# Patient Record
Sex: Female | Born: 1980 | Race: White | Hispanic: No | Marital: Married | Smoking: Never smoker
Health system: Southern US, Community
[De-identification: ages and names within clinical notes are randomized; demographics above are authoritative.]

## PROBLEM LIST (undated history)

## (undated) DIAGNOSIS — T8859XA Other complications of anesthesia, initial encounter: Secondary | ICD-10-CM

## (undated) DIAGNOSIS — R519 Headache, unspecified: Secondary | ICD-10-CM

## (undated) DIAGNOSIS — F419 Anxiety disorder, unspecified: Secondary | ICD-10-CM

## (undated) DIAGNOSIS — F32A Depression, unspecified: Secondary | ICD-10-CM

## (undated) DIAGNOSIS — T7840XA Allergy, unspecified, initial encounter: Secondary | ICD-10-CM

## (undated) HISTORY — DX: Depression, unspecified: F32.A

## (undated) HISTORY — DX: Allergy, unspecified, initial encounter: T78.40XA

## (undated) HISTORY — PX: CHOLECYSTECTOMY: SHX55

## (undated) HISTORY — DX: Anxiety disorder, unspecified: F41.9

## (undated) HISTORY — DX: Headache, unspecified: R51.9

## (undated) HISTORY — PX: FOOT SURGERY: SHX648

## (undated) HISTORY — PX: APPENDECTOMY: SHX54

## (undated) HISTORY — DX: Other complications of anesthesia, initial encounter: T88.59XA

## (undated) HISTORY — PX: PALATE / UVULA BIOPSY / EXCISION: SUR128

## (undated) HISTORY — PX: NASAL SINUS SURGERY: SHX719

---

## 2013-06-16 ENCOUNTER — Ambulatory Visit: Payer: Self-pay | Admitting: Physician Assistant

## 2014-07-23 ENCOUNTER — Ambulatory Visit: Payer: Self-pay | Admitting: Emergency Medicine

## 2016-02-27 ENCOUNTER — Encounter: Payer: Self-pay | Admitting: Emergency Medicine

## 2016-02-27 ENCOUNTER — Ambulatory Visit
Admission: EM | Admit: 2016-02-27 | Discharge: 2016-02-27 | Disposition: A | Discharge status: Home / Self Care | Payer: BLUE CROSS/BLUE SHIELD | Attending: Internal Medicine | Admitting: Internal Medicine

## 2016-02-27 ENCOUNTER — Ambulatory Visit (INDEPENDENT_AMBULATORY_CARE_PROVIDER_SITE_OTHER): Payer: BLUE CROSS/BLUE SHIELD

## 2016-02-27 DIAGNOSIS — S93402A Sprain of unspecified ligament of left ankle, initial encounter: Secondary | ICD-10-CM

## 2016-02-27 NOTE — ED Provider Notes (Signed)
[facility]    CSN: [id number] Arrival date & time: 02/27/16  1215   History   Chief Complaint Chief Complaint  Patient presents with  . Ankle Pain    HPI Dana Jennings is a 35 y.o. female. She was chasing her golden retriever across the yard today, stepped in a hole and twisted her left ankle. She has a history of several lateral left ankle sprains. She brought her own boot orthosis in, from previous injury. Does not have crutches. Lateral ankle is swollen and bruised. Was not able to weight bear. No other injury reported.     HPI  History reviewed. No pertinent past medical history.   Past Surgical History:  Procedure Laterality Date  . APPENDECTOMY    . CESAREAN SECTION    . CHOLECYSTECTOMY    . FOOT SURGERY Right   . NASAL SINUS SURGERY       Home Medications    Prior to Admission medications   Medication Sig Start Date End Date Taking? Authorizing Provider  buPROPion (WELLBUTRIN XL) 300 MG 24 hr tablet Take 300 mg by mouth daily.   Yes Historical Provider, MD  levonorgestrel (MIRENA) 20 MCG/24HR IUD 1 each by Intrauterine route once.   Yes Historical Provider, MD    Family History History reviewed. No pertinent family history.  Social History Social History  Substance Use Topics  . Smoking status: Never Smoker  . Smokeless tobacco: Never Used  . Alcohol use Yes     Allergies   Review of patient's allergies indicates no known allergies.   Review of Systems Review of Systems  All other systems reviewed and are negative.    Physical Exam Triage Vital Signs ED Triage Vitals  Enc Vitals Group     BP 02/27/16 1247 (!) 123/54     Pulse Rate 02/27/16 1247 79     Resp 02/27/16 1247 16     Temp 02/27/16 1247 99 F (37.2 C)     Temp Source 02/27/16 1247 Tympanic     SpO2 02/27/16 1247 100 %     Pain Score 02/27/16 1252 5   Updated Vital Signs BP (!) 123/54 (BP Location: Left Arm)   Pulse 79   Temp 99 F (37.2 C)  (Tympanic)   Resp 16   SpO2 100%  Physical Exam  Constitutional: She is oriented to person, place, and time. No distress.  Alert, nicely groomed  HENT:  Head: Atraumatic.  Eyes:  Conjugate gaze, no eye redness/drainage  Neck: Neck supple.  Cardiovascular: Normal rate.   Pulmonary/Chest: No respiratory distress.  Abdominal: She exhibits no distension.  Musculoskeletal: Normal range of motion.  No leg swelling Left lateral ankle is swollen, bruised inferiorly, diffusely tender. Range of motion at the ankle is restricted due to pain. Foot is warm, skin is intact. Able to wiggle her toes.  Neurological: She is alert and oriented to person, place, and time.  Skin: Skin is warm and dry.  No cyanosis  Nursing note and vitals reviewed.    UC Treatments / Results   Radiology Dg Ankle Complete Left  Result Date: 02/27/2016 CLINICAL DATA:  35 year old female with a history of left ankle injury EXAM: LEFT ANKLE COMPLETE - 3+ VIEW COMPARISON:  None. FINDINGS: Small bony defect at the distal left fibula, concerning for avulsion fracture. Associated soft tissue swelling. Irregularity at the medial malleolus, with the appearance of more chronic changes and/or remote injury. Degenerative changes posterior to the talus on the lateral view.  Joint effusion on the lateral view. IMPRESSION: Small avulsion fracture at the distal fibula with associated soft tissue swelling. Degenerative changes of the ankle Signed, Yvone NeuJaime S. Loreta AveWagner, DO [facility] Electronically Signed   By: Gilmer MorJaime  Wagner D.O.   On: 02/27/2016 13:51    Procedures Procedures (including critical care time) None  Final Clinical Impressions(s) / UC Diagnoses   Final diagnoses:  Left ankle sprain, initial encounter    Wear boot orthosis and use crutches; non weight bearing until followup with [facility] in about a week. Ice, advil or aleve, should help with swelling/pain.       Eustace MooreLaura W Chloey Ricard, MD 02/28/16 [phone]

## 2016-02-27 NOTE — Discharge Instructions (Signed)
Use ice 5-10 minutes 2-4 times daily for pain/swelling for the next 48 hours. Wear boot orthosis for comfort and splinting.  Elevate leg if throbbing pain occurs.  Advil or aleve otc should help with discomfort.    Use crutches and keep weight off of L leg until the xray result comes back sometime today; we will call you with the result.    If the ankle is broken, we would recommend followup with orthopedics in 7-10 days.  If sprained, followup with orthopedics as needed for persistent symptoms.

## 2016-02-27 NOTE — ED Triage Notes (Signed)
Patient c/o left ankle pain that started after she stepped in a hole while walking her dog this morning.

## 2016-02-27 NOTE — ED Notes (Signed)
Ice pack applied to left ankle.

## 2016-09-24 ENCOUNTER — Ambulatory Visit
Admission: EM | Admit: 2016-09-24 | Discharge: 2016-09-24 | Disposition: A | Discharge status: Home / Self Care | Payer: BLUE CROSS/BLUE SHIELD | Attending: Family Medicine | Admitting: Family Medicine

## 2016-09-24 ENCOUNTER — Encounter: Payer: Self-pay | Admitting: *Deleted

## 2016-09-24 DIAGNOSIS — J0101 Acute recurrent maxillary sinusitis: Secondary | ICD-10-CM | POA: Diagnosis not present

## 2016-09-24 LAB — RAPID STREP SCREEN (MED CTR MEBANE ONLY): STREPTOCOCCUS, GROUP A SCREEN (DIRECT): NEGATIVE

## 2016-09-24 MED ORDER — FLUTICASONE PROPIONATE 50 MCG/ACT NA SUSP: 2.0000 | Freq: Every day | NASAL | 0 refills | Status: DC

## 2016-09-24 MED ORDER — DOXYCYCLINE HYCLATE 100 MG PO CAPS: 100.0000 mg | ORAL_CAPSULE | Freq: Two times a day (BID) | ORAL | 0 refills | Status: DC

## 2016-09-24 MED ORDER — HYDROCOD POLST-CPM POLST ER 10-8 MG/5ML PO SUER: 5.0000 mL | Freq: Two times a day (BID) | ORAL | 0 refills | Status: DC

## 2016-09-24 NOTE — ED Triage Notes (Signed)
Facial pain, headache, nasal drainage- green, since Thursday, hx of sinus infections with previous sinus surgery. Also, last night awoke with severe sore throat and possible fever.

## 2016-09-24 NOTE — ED Provider Notes (Signed)
CSN: [id number]     Arrival date & time 09/24/16  1026 History   First MD Initiated Contact with Patient 09/24/16 1217     Chief Complaint  Patient presents with  . Facial Pain  . Sore Throat   (Consider location/radiation/quality/duration/timing/severity/associated sxs/prior Treatment) HPI  36 year old female is with a four-day history of facial pain headache nasal drainage of green this in a very sore throat that awaken her last night. She thought that she was feverish last night but did not take her temperature. History of recurrent sinusitis was lessened after having endoscopic sinus surgery but continues to have at least on a yearly basis. Her facial pain is very painful in her throat seems to be her worst at the present time.       History reviewed. No pertinent past medical history. Past Surgical History:  Procedure Laterality Date  . APPENDECTOMY    . CESAREAN SECTION    . CHOLECYSTECTOMY    . FOOT SURGERY Right   . NASAL SINUS SURGERY     History reviewed. No pertinent family history. Social History  Substance Use Topics  . Smoking status: Never Smoker  . Smokeless tobacco: Never Used  . Alcohol use Yes   OB History    No data available     Review of Systems  Constitutional: Positive for activity change, chills, fatigue and fever.  HENT: Positive for congestion, postnasal drip, rhinorrhea, sinus pain, sinus pressure and sore throat.   Respiratory: Positive for cough. Negative for shortness of breath and wheezing.   All other systems reviewed and are negative.   Allergies  Patient has no known allergies.  Home Medications   Prior to Admission medications   Medication Sig Start Date End Date Taking? Authorizing Provider  buPROPion (WELLBUTRIN XL) 300 MG 24 hr tablet Take 300 mg by mouth daily.   Yes Historical Provider, MD  levonorgestrel (MIRENA) 20 MCG/24HR IUD 1 each by Intrauterine route once.   Yes Historical Provider, MD  chlorpheniramine-HYDROcodone  (TUSSIONEX PENNKINETIC ER) 10-8 MG/5ML SUER Take 5 mLs by mouth 2 (two) times daily. 09/24/16   Lutricia FeilWilliam P Farah Lepak, PA-C  doxycycline (VIBRAMYCIN) 100 MG capsule Take 1 capsule (100 mg total) by mouth 2 (two) times daily. 09/24/16   Lutricia FeilWilliam P Kayode Petion, PA-C  fluticasone (FLONASE) 50 MCG/ACT nasal spray Place 2 sprays into both nostrils daily. 09/24/16   Lutricia FeilWilliam P Vaun Hyndman, PA-C   Meds Ordered and Administered this Visit  Medications - No data to display  BP (!) 143/76 (BP Location: Left Arm)   Pulse 73   Temp 98.2 F (36.8 C) (Oral)   Resp 16   Ht 5\' 8"  (1.727 m)   Wt 250 lb (113.4 kg)   SpO2 100%   BMI 38.01 kg/m  No data found.   Physical Exam  Constitutional: She is oriented to person, place, and time. She appears well-developed and well-nourished. No distress.  HENT:  Head: Normocephalic and atraumatic.  Right Ear: External ear normal.  Left Ear: External ear normal.  Nose: Nose normal.  Mouth/Throat: Oropharynx is clear and moist. No oropharyngeal exudate.  Eyes: Pupils are equal, round, and reactive to light. Right eye exhibits no discharge. Left eye exhibits no discharge.  Neck: Normal range of motion. Neck supple.  Pulmonary/Chest: She is in respiratory distress. She has wheezes. She has rales.  Musculoskeletal: Normal range of motion.  Neurological: She is alert and oriented to person, place, and time.  Skin: Skin is warm and dry. She is  not diaphoretic.  Psychiatric: She has a normal mood and affect. Her behavior is normal. Judgment and thought content normal.  Nursing note and vitals reviewed.   Urgent Care Course     Procedures (including critical care time)  Labs Review Labs Reviewed  RAPID STREP SCREEN (NOT AT [facility])  CULTURE, GROUP A STREP [facility])    Imaging Review No results found.   Visual Acuity Review  Right Eye Distance:   Left Eye Distance:   Bilateral Distance:    Right Eye Near:   Left Eye Near:    Bilateral Near:         MDM   1. Acute  recurrent maxillary sinusitis    Discharge Medication List as of 09/24/2016 12:26 PM    START taking these medications   Details  chlorpheniramine-HYDROcodone (TUSSIONEX PENNKINETIC ER) 10-8 MG/5ML SUER Take 5 mLs by mouth 2 (two) times daily., Starting Mon 09/24/2016, Print    doxycycline (VIBRAMYCIN) 100 MG capsule Take 1 capsule (100 mg total) by mouth 2 (two) times daily., Starting Mon 09/24/2016, Normal    fluticasone (FLONASE) 50 MCG/ACT nasal spray Place 2 sprays into both nostrils daily., Starting Mon 09/24/2016, Normal        Plan:  1. Test/x-ray results and diagnosis reviewed with patient 2. rx as per orders; risks, benefits, potential side effects reviewed with patient 3. Recommend supportive treatment with Flonase. Use of Tussionex at nighttime for coughing. During the daytime gargle with salt water or use lozenges. Not improving follow-up with her primary care physician 4. F/u prn if symptoms worsen or don't improve     Lutricia Feil, PA-C 09/24/16 1242

## 2016-09-27 LAB — CULTURE, GROUP A STREP (THRC)

## 2020-06-01 ENCOUNTER — Other Ambulatory Visit: Payer: Self-pay

## 2020-06-01 ENCOUNTER — Encounter: Payer: Self-pay | Admitting: Obstetrics and Gynecology

## 2020-06-01 ENCOUNTER — Ambulatory Visit: Payer: BC Managed Care – PPO | Admitting: Obstetrics and Gynecology

## 2020-06-01 VITALS — BP 127/89 | Ht 68.0 in | Wt 267.0 lb

## 2020-06-01 DIAGNOSIS — R102 Pelvic and perineal pain: Secondary | ICD-10-CM | POA: Diagnosis not present

## 2020-06-01 DIAGNOSIS — R222 Localized swelling, mass and lump, trunk: Secondary | ICD-10-CM | POA: Diagnosis not present

## 2020-06-01 NOTE — Progress Notes (Signed)
NP Fibroids/Mass. Procedure room

## 2020-06-01 NOTE — Progress Notes (Signed)
Obstetrics & Gynecology Office Visit   Chief Complaint:  Chief Complaint  Patient presents with  . Establish Care    Fibroids/Mass    History of Present Illness: 39 year caucasian female presenting for evaluation of pelvic pain.  The onset was over a year ago and initially was scheduled for evaluation by general surgery until pandemic hit and her appointment was canceled.  The pain is located in the left lower quadrant.  It is not temporally correlated to menstrual cycles (the patient currently is using a Mirena IUD).  The pain is located in the anterior left lower quadrant abdominal wall and she abel to palpate a mass in the are which she first noted in the shower.  Pain is waxing and waning in nature.  No dyspareunia.  No constipation, diarrhea, hematochezia, or rectal discomfort with bowl movement. The patient is status laparoscopic post emergency appendectomy in 2014 as well as well as prior laparoscopic cholecystectomy.  Imaging from 2014 otherwise showed normal abdominal and pelvic anatomy and external soft tissue imaged normally.  She under a [facility] at [facility] recently on 05/16/2020 which was essential normal other than two small intramural fibroid which do not explain her symptom constellation.  IUD was also shows to be in proper location with appropriate deployment of both arms within the endometrial cavity.  Review of Systems: Review of Systems  Constitutional: Negative.   Gastrointestinal: Positive for abdominal pain. Negative for blood in stool, constipation, diarrhea, heartburn, melena, nausea and vomiting.  Genitourinary: Negative.   Skin: Negative.      Past Medical History:  History reviewed. No pertinent past medical history.  Past Surgical History:  Past Surgical History:  Procedure Laterality Date  . APPENDECTOMY    . CESAREAN SECTION    . CHOLECYSTECTOMY    . FOOT SURGERY Right   . NASAL SINUS SURGERY      Gynecologic History: No LMP recorded. (Menstrual status:  IUD).  Obstetric History: No obstetric history on file.  Family History:  History reviewed. No pertinent family history.  Social History:  Social History   Socioeconomic History  . Marital status: Married    Spouse name: Not on file  . Number of children: Not on file  . Years of education: Not on file  . Highest education level: Not on file  Occupational History  . Not on file  Tobacco Use  . Smoking status: Never Smoker  . Smokeless tobacco: Never Used  Vaping Use  . Vaping Use: Never used  Substance and Sexual Activity  . Alcohol use: Yes  . Drug use: No  . Sexual activity: Yes    Birth control/protection: I.U.D.  Other Topics Concern  . Not on file  Social History Narrative  . Not on file   Social Determinants of Health   Financial Resource Strain:   . Difficulty of Paying Living Expenses: Not on file  Food Insecurity:   . Worried About Programme researcher, broadcasting/film/video in the Last Year: Not on file  . Ran Out of Food in the Last Year: Not on file  Transportation Needs:   . Lack of Transportation (Medical): Not on file  . Lack of Transportation (Non-Medical): Not on file  Physical Activity:   . Days of Exercise per Week: Not on file  . Minutes of Exercise per Session: Not on file  Stress:   . Feeling of Stress : Not on file  Social Connections:   . Frequency of Communication with Friends and  Family: Not on file  . Frequency of Social Gatherings with Friends and Family: Not on file  . Attends Religious Services: Not on file  . Active Member of Clubs or Organizations: Not on file  . Attends Banker Meetings: Not on file  . Marital Status: Not on file  Intimate Partner Violence:   . Fear of Current or Ex-Partner: Not on file  . Emotionally Abused: Not on file  . Physically Abused: Not on file  . Sexually Abused: Not on file    Allergies:  Allergies  Allergen Reactions  . Ceclor [Cefaclor] Rash    Medications: Prior to Admission medications     Medication Sig Start Date End Date Taking? Authorizing Provider  ALPRAZolam Prudy Feeler) 0.5 MG tablet Take 0.5 mg by mouth 3 (three) times daily as needed. 02/16/20  Yes [provider]  buPROPion (WELLBUTRIN XL) 300 MG 24 hr tablet Take 300 mg by mouth daily.   Yes [provider]  levonorgestrel (MIRENA) 20 MCG/24HR IUD 1 each by Intrauterine route once.   Yes [provider]  chlorpheniramine-HYDROcodone (TUSSIONEX PENNKINETIC ER) 10-8 MG/5ML SUER Take 5 mLs by mouth 2 (two) times daily. Patient not taking: Reported on 06/01/2020 09/24/16   Lutricia Feil, PA-C  doxycycline (VIBRAMYCIN) 100 MG capsule Take 1 capsule (100 mg total) by mouth 2 (two) times daily. Patient not taking: Reported on 06/01/2020 09/24/16   Lutricia Feil, PA-C  fluticasone [facility]) 50 MCG/ACT nasal spray Place 2 sprays into both nostrils daily. Patient not taking: Reported on 06/01/2020 09/24/16   Lutricia Feil, PA-C    Physical Exam Vitals:  Vitals:   06/01/20 1444  BP: 127/89   No LMP recorded. (Menstrual status: IUD).  General: NAD, well nourished, appears stated age HEENT: normocephalic, anicteric Pulmonary: No increased work of breathing Abdomen: soft, point tenderness in the LLQ, there is a small non-mobile marble size lesion appreciated in the left lower quadrant just lateral and superior to the patient prior cesarean scar, non-distended.  Umbilicus without lesions.  No hepatomegaly, splenomegaly or masses palpable. No evidence of hernia  Extremities: no edema, erythema, or tenderness Neurologic: Grossly intact Psychiatric: mood appropriate, affect full  Physical Exam Abdominal:      Assessment: 39 y.o. pelvic pain, anterior abdominal wall mass  Plan: Problem List Items Addressed This Visit    None    Visit Diagnoses    Abdominal wall mass    -  Primary   Relevant Orders   MR PELVIS W CONTRAST   Pelvic pain       Relevant Orders   MR PELVIS W CONTRAST      1) Anterior abdominal wall pain with palpable nodule - DDx includes C-section scar endometriosis, lipoma or hernia deemed less likely although a small port site hernia from her appendectomy is possible as that would not have shown up on imaging in 2014 preceding her appendectomy.  It has been present for over a year with a waxing and waning course but recently been more pronounce.  Will proceed with MRI pelvis to further evaluate.    2) A total of 20 minutes were spent in face-to-face contact with the patient during this encounter with over half of that time devoted to counseling and coordination of care.  3) Follow up - will contact patient with imaging results once these become available and have been reviewed    Vena Austria, MD, [facility], [facility]

## 2020-06-07 ENCOUNTER — Other Ambulatory Visit: Payer: Self-pay | Admitting: Obstetrics and Gynecology

## 2020-06-07 DIAGNOSIS — R1904 Left lower quadrant abdominal swelling, mass and lump: Secondary | ICD-10-CM

## 2020-06-13 ENCOUNTER — Ambulatory Visit
Admission: RE | Admit: 2020-06-13 | Discharge: 2020-06-13 | Disposition: A | Discharge status: Home / Self Care | Payer: BC Managed Care – PPO | Source: Ambulatory Visit | Attending: Obstetrics and Gynecology | Admitting: Obstetrics and Gynecology

## 2020-06-13 ENCOUNTER — Other Ambulatory Visit: Payer: Self-pay

## 2020-06-13 ENCOUNTER — Other Ambulatory Visit: Payer: Self-pay | Admitting: Obstetrics and Gynecology

## 2020-06-13 DIAGNOSIS — R1904 Left lower quadrant abdominal swelling, mass and lump: Secondary | ICD-10-CM | POA: Insufficient documentation

## 2020-06-16 ENCOUNTER — Other Ambulatory Visit: Payer: Self-pay | Admitting: Obstetrics and Gynecology

## 2020-06-16 DIAGNOSIS — R1904 Left lower quadrant abdominal swelling, mass and lump: Secondary | ICD-10-CM

## 2020-06-16 DIAGNOSIS — R102 Pelvic and perineal pain: Secondary | ICD-10-CM

## 2020-06-17 ENCOUNTER — Telehealth: Payer: Self-pay | Admitting: Obstetrics and Gynecology

## 2020-06-17 NOTE — Telephone Encounter (Signed)
Pt insurance wants her to go to another provider for CT  Scan vs provider requested by you. Pt request call back.

## 2020-06-21 ENCOUNTER — Ambulatory Visit
Admission: RE | Admit: 2020-06-21 | Discharge: 2020-06-21 | Disposition: A | Discharge status: Home / Self Care | Payer: BC Managed Care – PPO | Source: Ambulatory Visit | Attending: Obstetrics and Gynecology | Admitting: Obstetrics and Gynecology

## 2020-06-21 ENCOUNTER — Other Ambulatory Visit: Payer: Self-pay

## 2020-06-21 DIAGNOSIS — R102 Pelvic and perineal pain: Secondary | ICD-10-CM

## 2020-06-21 MED ORDER — IOHEXOL 300 MG/ML  SOLN
150.0000 mL | Freq: Once | INTRAMUSCULAR | Status: AC | PRN
  Administered 2020-06-21: 125 mL via INTRAVENOUS

## 2020-06-27 ENCOUNTER — Telehealth: Payer: Self-pay | Admitting: Surgery

## 2020-06-27 ENCOUNTER — Other Ambulatory Visit: Payer: Self-pay

## 2020-06-27 ENCOUNTER — Ambulatory Visit (INDEPENDENT_AMBULATORY_CARE_PROVIDER_SITE_OTHER): Payer: BC Managed Care – PPO | Admitting: Surgery

## 2020-06-27 ENCOUNTER — Encounter: Payer: Self-pay | Admitting: Surgery

## 2020-06-27 VITALS — BP 130/77 | HR 80 | Temp 98.4°F | Ht 68.0 in | Wt 275.0 lb

## 2020-06-27 DIAGNOSIS — R1904 Left lower quadrant abdominal swelling, mass and lump: Secondary | ICD-10-CM | POA: Diagnosis not present

## 2020-06-27 NOTE — Patient Instructions (Addendum)
We will schedule you for surgery at [facility] with Dr Everlene Farrier. You will go home the same day as surgery.   Please refer to your [facility] surgery sheet. Our surgery scheduler will be in contact with you to look at surgery dates and go over information.   You should expect to be out of work for about a week to ten days.

## 2020-06-27 NOTE — Telephone Encounter (Signed)
Outgoing call is made, left message for patient to call.  Please advise patient of Pre-Admission date/time, COVID Testing date and Surgery date.  Surgery Date: 07/01/20 Preadmission Testing Date: 06/29/20 (phone 8a-1p) Covid Testing Date: 06/30/20 - patient [facility] to [facility] [facility]) between 8a-1p   Also patient needs to call [phone], between 1-3:00pm the day before surgery, to find out what time to arrive for surgery.

## 2020-06-27 NOTE — Telephone Encounter (Signed)
Received call back from patient.  She is now informed of all dates regarding her surgery and voices understanding.

## 2020-06-28 ENCOUNTER — Encounter: Payer: Self-pay | Admitting: Surgery

## 2020-06-28 NOTE — H&P (View-Only) (Signed)
Patient ID: Dana Jennings, female   DOB: 02/24/1981, 39 y.o.   MRN: [id number]  HPI Dana Jennings is a 39 y.o. female seen in consultation at the request of Dr. Staebler.  Reports feeling not within the left lower quadrant.  She experiences pain that is dull in nature and constant.  This has been happening for over a year.  She reports that the pain is exacerbated when she pushes on her abdominal wall but there is no specific alleviating factors.  She denies any fevers any chills any weight loss.  He does have a significant history of appendectomy as well as laparoscopic cholecystectomy more than 7 years ago.  No complications.  She did have a C-section 14 years ago.  He was seen by Dr. Staebler and also by [facility] and they perform a work-up showing evidence of an intramural fibroid.  She subsequently had an ultrasound and CT scan of the pelvis that I have personally reviewed there is evidence of a 2 x 1.5 cm mass within the abdominal wall on the left lower quadrant this is superficial to the fascia.  There is no evidence of any abdominal wall hernias. She is otherwise healthy and is able to perform more than 4 METS of activity without any shortness of breath or chest pain.  She does have a BMI of 41.8 She did have a normal CMP and hemoglobin 1 AC. She is accompanied by her husband who is a Fire chief  HPI  Past Medical History:  Diagnosis Date  . Allergy     Past Surgical History:  Procedure Laterality Date  . APPENDECTOMY    . CESAREAN SECTION    . CHOLECYSTECTOMY    . FOOT SURGERY Right   . NASAL SINUS SURGERY      No family history on file.  Social History Social History   Tobacco Use  . Smoking status: Never Smoker  . Smokeless tobacco: Never Used  Vaping Use  . Vaping Use: Never used  Substance Use Topics  . Alcohol use: Yes  . Drug use: No    Allergies  Allergen Reactions  . Ceclor [Cefaclor] Hives and Rash    Current Outpatient  Medications  Medication Sig Dispense Refill  . ALPRAZolam (XANAX) 0.5 MG tablet Take 0.5 mg by mouth daily as needed for anxiety.    . buPROPion (WELLBUTRIN XL) 300 MG 24 hr tablet Take 300 mg by mouth daily.    . EPINEPHrine 0.3 mg/0.3 mL IJ SOAJ injection Inject 0.3 mg into the muscle as needed for anaphylaxis.    . levonorgestrel (MIRENA) 20 MCG/24HR IUD 1 each by Intrauterine route once.    . Ascorbic Acid (VITAMIN C PO) Take 1 tablet by mouth daily.    . cetirizine (ZYRTEC) 10 MG tablet Take 10 mg by mouth daily.    . ibuprofen (ADVIL) 200 MG tablet Take 800 mg by mouth every 6 (six) hours as needed for mild pain or headache.    . metroNIDAZOLE (METROGEL) 0.75 % gel Apply 1 application topically at bedtime.    . Multiple Vitamins-Minerals (ZINC PO) Take 1 tablet by mouth daily.    . VITAMIN D PO Take 1 capsule by mouth every other day.     No current facility-administered medications for this visit.     Review of Systems Full ROS  was asked and was negative except for the information on the HPI  Physical Exam Blood pressure 130/77, pulse 80, temperature 98.4 F (36.9 C),   height 5\' 8"  (1.727 m), weight 275 lb (124.7 kg), SpO2 98 %. CONSTITUTIONAL: NAD EYES: Pupils are equal, round,  to light, Sclera are non-icteric. EARS, NOSE, MOUTH AND THROAT: She is wearing a mask hearing is intact to voice. LYMPH NODES:  Lymph nodes in the neck are normal. NEck: no masses, no JVD , nml thyroid RESPIRATORY:  Lungs are clear. There is normal respiratory effort, with equal breath sounds bilaterally, and without pathologic use of accessory muscles. CARDIOVASCULAR: Heart is regular without murmurs, gallops, or rubs. GI: The abdomen is  soft, there is evidence of a left lower quadrant abdominal wall nodule/mass that is tender to palpation.  Does not feel like cancer. GU: Rectal deferred.   MUSCULOSKELETAL: Normal muscle strength and tone. No cyanosis or edema.   SKIN: Turgor is good and there are  no pathologic skin lesions or ulcers. NEUROLOGIC: Motor and sensation is grossly normal. Cranial nerves are grossly intact. PSYCH:  Oriented to person, place and time. Affect is normal.  Data Reviewed I have personally reviewed the patient's imaging, laboratory findings and medical records.    Assessment/Plan 39 year old female with past symptomatic abdominal wall mass located in the left lower quadrant near the left suprapubic area.  Differentials will include reactive lymph node versus benign neoplasia versus endometrioma. Given symptomatology I do think that excision is indicated.  I do think that given the depth and location she is better served to have this excised under general anesthetic.  Procedure discussed with the patient in detail.  Risks, benefits and possible complications including but not limited to: Bleeding, recurrence, chronic pain, upgrading of her pathology, seroma and infection.  She understands and wished to proceed. A Copy of this report was sent to the referring provider   20, MD FACS General Surgeon 06/28/2020, 7:37 AM

## 2020-06-28 NOTE — Progress Notes (Signed)
Patient ID: Dana Jennings, female   DOB: Jul 05, 1981, 39 y.o.   MRN: [id number]  HPI Dana Jennings is a 39 y.o. female seen in consultation at the request of Dr. Bonney Aid.  Reports feeling not within the left lower quadrant.  She experiences pain that is dull in nature and constant.  This has been happening for over a year.  She reports that the pain is exacerbated when she pushes on her abdominal wall but there is no specific alleviating factors.  She denies any fevers any chills any weight loss.  He does have a significant history of appendectomy as well as laparoscopic cholecystectomy more than 7 years ago.  No complications.  She did have a C-section 14 years ago.  He was seen by Dr. Bonney Aid and also by [facility] gynecology and they perform a work-up showing evidence of an intramural fibroid.  She subsequently had an ultrasound and CT scan of the pelvis that I have personally reviewed there is evidence of a 2 x 1.5 cm mass within the abdominal wall on the left lower quadrant this is superficial to the fascia.  There is no evidence of any abdominal wall hernias. She is otherwise healthy and is able to perform more than 4 METS of activity without any shortness of breath or chest pain.  She does have a BMI of 41.8 She did have a normal CMP and hemoglobin 1 AC. She is accompanied by her husband who is a Engineer, structural  HPI  Past Medical History:  Diagnosis Date  . Allergy     Past Surgical History:  Procedure Laterality Date  . APPENDECTOMY    . CESAREAN SECTION    . CHOLECYSTECTOMY    . FOOT SURGERY Right   . NASAL SINUS SURGERY      No family history on file.  Social History Social History   Tobacco Use  . Smoking status: Never Smoker  . Smokeless tobacco: Never Used  Vaping Use  . Vaping Use: Never used  Substance Use Topics  . Alcohol use: Yes  . Drug use: No    Allergies  Allergen Reactions  . Ceclor [Cefaclor] Hives and Rash    Current Outpatient  Medications  Medication Sig Dispense Refill  . ALPRAZolam (XANAX) 0.5 MG tablet Take 0.5 mg by mouth daily as needed for anxiety.    Marland Kitchen buPROPion (WELLBUTRIN XL) 300 MG 24 hr tablet Take 300 mg by mouth daily.    Marland Kitchen EPINEPHrine 0.3 mg/0.3 mL IJ SOAJ injection Inject 0.3 mg into the muscle as needed for anaphylaxis.    Marland Kitchen levonorgestrel (MIRENA) 20 MCG/24HR IUD 1 each by Intrauterine route once.    . Ascorbic Acid (VITAMIN C PO) Take 1 tablet by mouth daily.    . cetirizine (ZYRTEC) 10 MG tablet Take 10 mg by mouth daily.    Marland Kitchen ibuprofen (ADVIL) 200 MG tablet Take 800 mg by mouth every 6 (six) hours as needed for mild pain or headache.    . metroNIDAZOLE (METROGEL) 0.75 % gel Apply 1 application topically at bedtime.    . Multiple Vitamins-Minerals (ZINC PO) Take 1 tablet by mouth daily.    Marland Kitchen VITAMIN D PO Take 1 capsule by mouth every other day.     No current facility-administered medications for this visit.     Review of Systems Full ROS  was asked and was negative except for the information on the HPI  Physical Exam Blood pressure 130/77, pulse 80, temperature 98.4 F (36.9 C),  height 5\' 8"  (1.727 m), weight 275 lb (124.7 kg), SpO2 98 %. CONSTITUTIONAL: NAD EYES: Pupils are equal, round,  to light, Sclera are non-icteric. EARS, NOSE, MOUTH AND THROAT: She is wearing a mask hearing is intact to voice. LYMPH NODES:  Lymph nodes in the neck are normal. NEck: no masses, no JVD , nml thyroid RESPIRATORY:  Lungs are clear. There is normal respiratory effort, with equal breath sounds bilaterally, and without pathologic use of accessory muscles. CARDIOVASCULAR: Heart is regular without murmurs, gallops, or rubs. GI: The abdomen is  soft, there is evidence of a left lower quadrant abdominal wall nodule/mass that is tender to palpation.  Does not feel like cancer. GU: Rectal deferred.   MUSCULOSKELETAL: Normal muscle strength and tone. No cyanosis or edema.   SKIN: Turgor is good and there are  no pathologic skin lesions or ulcers. NEUROLOGIC: Motor and sensation is grossly normal. Cranial nerves are grossly intact. PSYCH:  Oriented to person, place and time. Affect is normal.  Data Reviewed I have personally reviewed the patient's imaging, laboratory findings and medical records.    Assessment/Plan 39 year old female with past symptomatic abdominal wall mass located in the left lower quadrant near the left suprapubic area.  Differentials will include reactive lymph node versus benign neoplasia versus endometrioma. Given symptomatology I do think that excision is indicated.  I do think that given the depth and location she is better served to have this excised under general anesthetic.  Procedure discussed with the patient in detail.  Risks, benefits and possible complications including but not limited to: Bleeding, recurrence, chronic pain, upgrading of her pathology, seroma and infection.  She understands and wished to proceed. A Copy of this report was sent to the referring provider   20, MD FACS General Surgeon 06/28/2020, 7:37 AM

## 2020-06-29 ENCOUNTER — Other Ambulatory Visit
Admission: RE | Admit: 2020-06-29 | Discharge: 2020-06-29 | Disposition: A | Discharge status: Home / Self Care | Payer: BC Managed Care – PPO | Source: Ambulatory Visit | Attending: Surgery | Admitting: Surgery

## 2020-06-29 ENCOUNTER — Other Ambulatory Visit: Payer: Self-pay

## 2020-06-29 DIAGNOSIS — N803 Endometriosis of pelvic peritoneum: Secondary | ICD-10-CM | POA: Diagnosis not present

## 2020-06-29 DIAGNOSIS — Z01812 Encounter for preprocedural laboratory examination: Secondary | ICD-10-CM | POA: Insufficient documentation

## 2020-06-29 DIAGNOSIS — Z9049 Acquired absence of other specified parts of digestive tract: Secondary | ICD-10-CM | POA: Diagnosis not present

## 2020-06-29 DIAGNOSIS — Z791 Long term (current) use of non-steroidal anti-inflammatories (NSAID): Secondary | ICD-10-CM | POA: Diagnosis not present

## 2020-06-29 DIAGNOSIS — R229 Localized swelling, mass and lump, unspecified: Secondary | ICD-10-CM | POA: Diagnosis present

## 2020-06-29 DIAGNOSIS — Z79899 Other long term (current) drug therapy: Secondary | ICD-10-CM | POA: Diagnosis not present

## 2020-06-29 DIAGNOSIS — Z793 Long term (current) use of hormonal contraceptives: Secondary | ICD-10-CM | POA: Diagnosis not present

## 2020-06-29 DIAGNOSIS — Z881 Allergy status to other antibiotic agents status: Secondary | ICD-10-CM | POA: Diagnosis not present

## 2020-06-29 DIAGNOSIS — Z20822 Contact with and (suspected) exposure to covid-19: Secondary | ICD-10-CM | POA: Insufficient documentation

## 2020-06-29 HISTORY — DX: Depression, unspecified: F32.A

## 2020-06-29 HISTORY — DX: Anxiety disorder, unspecified: F41.9

## 2020-06-29 HISTORY — DX: Headache, unspecified: R51.9

## 2020-06-29 HISTORY — DX: Other complications of anesthesia, initial encounter: T88.59XA

## 2020-06-29 NOTE — Patient Instructions (Signed)
Your procedure is scheduled on: Friday 07/01/20.  Report to THE FIRST FLOOR REGISTRATION DESK IN THE MEDICAL MALL ON THE MORNING OF SURGERY FIRST, THEN YOU WILL CHECK IN AT THE SURGERY INFORMATION DESK LOCATED OUTSIDE THE SAME DAY SURGERY DEPARTMENT LOCATED ON 2ND FLOOR MEDICAL MALL ENTRANCE.  To find out your arrival time please call [phone] between 1PM - 3PM on Thursday 06/30/20.   Remember: Instructions that are not followed completely may result in serious medical risk, up to and including death, or upon the discretion of your surgeon and anesthesiologist your surgery may need to be rescheduled.     __X__ 1. Do not eat food after midnight the night before your procedure.                 No gum chewing or hard candies. You may drink clear liquids up to 2 hours                 before you are scheduled to arrive for your surgery- DO NOT drink clear                 liquids within 2 hours of the start of your surgery.                 Clear Liquids include:  water, apple juice without pulp, clear carbohydrate                 drink such as Clearfast or Gatorade, Black Coffee or Tea (Do not add                 milk or creamer to coffee or tea).  __X__2.  On the morning of surgery brush your teeth with toothpaste and water, you may rinse your mouth with mouthwash if you wish.  Do not swallow any toothpaste or mouthwash.    __X__ 3.  No Alcohol for 24 hours before or after surgery.  __X__ 4.  Do Not Smoke or use e-cigarettes For 24 Hours Prior to Your Surgery.                 Do not use any chewable tobacco products for at least 6 hours prior to                 surgery.  __X__5.  Notify your doctor if there is any change in your medical condition      (cold, fever, infections).      Do NOT wear jewelry, make-up, hairpins, clips or nail polish. Do NOT wear lotions, powders, or perfumes.  Do NOT shave 48 hours prior to surgery. Men may shave face and neck. Do NOT bring valuables to  the hospital.     [facility] is not responsible for any belongings or valuables.   Contacts, dentures/partials or body piercings may not be worn into surgery. Bring a case for your contacts, glasses or hearing aids, a denture cup will be supplied.   Patients discharged the day of surgery will not be allowed to drive home.     __X__ Take these medicines the morning of surgery with A SIP OF WATER:     1. buPROPion (WELLBUTRIN XL)   2. cetirizine (ZYRTEC)    __X__ Use CHG Soap as directed.  __X__ Stop Anti-inflammatories 7 days before surgery such as Advil, Ibuprofen, Motrin, BC or Goodies Powder, Naprosyn, Naproxen, Aleve, Aspirin, Meloxicam. May take Tylenol if needed for pain or discomfort.   __X__Do not start taking any  new herbal supplements or vitamins prior to your procedure.  __X__ Stop the following herbal supplements or vitamins:  Ascorbic Acid   Wear comfortable clothing (specific to your surgery type) to the hospital.  Plan for stool softeners for home use; pain medications have a tendency to cause constipation. You can also help prevent constipation by eating foods high in fiber such as fruits and vegetables and drinking plenty of fluids as your diet allows.  After surgery, you can prevent lung complications by doing breathing exercises.Take deep breaths and cough every 1-2 hours. Your doctor may order a device called an Incentive Spirometer to help you take deep breaths.  Please call the Pre-Admissions Testing Department at [phone] if you have any questions about these instructions.

## 2020-06-30 ENCOUNTER — Other Ambulatory Visit
Admission: RE | Admit: 2020-06-30 | Discharge: 2020-06-30 | Disposition: A | Discharge status: Home / Self Care | Payer: BC Managed Care – PPO | Source: Ambulatory Visit | Attending: Surgery | Admitting: Surgery

## 2020-06-30 DIAGNOSIS — N803 Endometriosis of pelvic peritoneum: Secondary | ICD-10-CM | POA: Diagnosis not present

## 2020-06-30 LAB — SARS CORONAVIRUS 2 (TAT 6-24 HRS): SARS Coronavirus 2: NEGATIVE

## 2020-07-01 ENCOUNTER — Ambulatory Visit: Payer: BC Managed Care – PPO | Admitting: Urgent Care

## 2020-07-01 ENCOUNTER — Ambulatory Visit
Admission: RE | Admit: 2020-07-01 | Discharge: 2020-07-01 | Disposition: A | Discharge status: Home / Self Care | Payer: BC Managed Care – PPO | Attending: Surgery | Admitting: Surgery

## 2020-07-01 ENCOUNTER — Encounter: Payer: Self-pay | Admitting: Surgery

## 2020-07-01 ENCOUNTER — Encounter
Admission: RE | Disposition: A | Discharge status: Home / Self Care | Payer: Self-pay | Source: Home / Self Care | Attending: Surgery

## 2020-07-01 ENCOUNTER — Other Ambulatory Visit: Payer: Self-pay

## 2020-07-01 DIAGNOSIS — Z9049 Acquired absence of other specified parts of digestive tract: Secondary | ICD-10-CM | POA: Insufficient documentation

## 2020-07-01 DIAGNOSIS — Z20822 Contact with and (suspected) exposure to covid-19: Secondary | ICD-10-CM | POA: Insufficient documentation

## 2020-07-01 DIAGNOSIS — Z791 Long term (current) use of non-steroidal anti-inflammatories (NSAID): Secondary | ICD-10-CM | POA: Insufficient documentation

## 2020-07-01 DIAGNOSIS — N803 Endometriosis of pelvic peritoneum: Secondary | ICD-10-CM | POA: Diagnosis not present

## 2020-07-01 DIAGNOSIS — R1904 Left lower quadrant abdominal swelling, mass and lump: Secondary | ICD-10-CM

## 2020-07-01 DIAGNOSIS — N808 Other endometriosis: Secondary | ICD-10-CM | POA: Diagnosis not present

## 2020-07-01 DIAGNOSIS — Z793 Long term (current) use of hormonal contraceptives: Secondary | ICD-10-CM | POA: Insufficient documentation

## 2020-07-01 DIAGNOSIS — Z881 Allergy status to other antibiotic agents status: Secondary | ICD-10-CM | POA: Insufficient documentation

## 2020-07-01 DIAGNOSIS — Z79899 Other long term (current) drug therapy: Secondary | ICD-10-CM | POA: Insufficient documentation

## 2020-07-01 HISTORY — PX: EXCISION MASS ABDOMINAL: SHX6701

## 2020-07-01 LAB — POCT PREGNANCY, URINE: Preg Test, Ur: NEGATIVE

## 2020-07-01 SURGERY — EXCISION, MASS, TORSO
Anesthesia: General | Laterality: Left

## 2020-07-01 MED ORDER — CHLORHEXIDINE GLUCONATE 0.12 % MT SOLN
OROMUCOSAL | Status: AC
  Administered 2020-07-01: 08:00:00 15 mL via OROMUCOSAL
  Filled 2020-07-01: qty 15

## 2020-07-01 MED ORDER — PROPOFOL 10 MG/ML IV BOLUS
INTRAVENOUS | Status: DC | PRN
  Administered 2020-07-01: 200 mg via INTRAVENOUS

## 2020-07-01 MED ORDER — LACTATED RINGERS IV SOLN: INTRAVENOUS | Status: DC | PRN

## 2020-07-01 MED ORDER — SCOPOLAMINE 1 MG/3DAYS TD PT72
1.0000 | MEDICATED_PATCH | TRANSDERMAL | Status: DC
  Administered 2020-07-01: 1.5 mg via TRANSDERMAL

## 2020-07-01 MED ORDER — ORAL CARE MOUTH RINSE: 15.0000 mL | Freq: Once | OROMUCOSAL | Status: AC

## 2020-07-01 MED ORDER — CELECOXIB 200 MG PO CAPS: 200.0000 mg | ORAL_CAPSULE | ORAL | Status: AC

## 2020-07-01 MED ORDER — MIDAZOLAM HCL 2 MG/2ML IJ SOLN
INTRAMUSCULAR | Status: DC | PRN
  Administered 2020-07-01: 2 mg via INTRAVENOUS

## 2020-07-01 MED ORDER — SEVOFLURANE IN SOLN
RESPIRATORY_TRACT | Status: AC
  Filled 2020-07-01: qty 250

## 2020-07-01 MED ORDER — CHLORHEXIDINE GLUCONATE CLOTH 2 % EX PADS
6.0000 | MEDICATED_PAD | Freq: Once | CUTANEOUS | Status: AC
  Administered 2020-07-01: 09:00:00 6 via TOPICAL

## 2020-07-01 MED ORDER — ROCURONIUM BROMIDE 100 MG/10ML IV SOLN
INTRAVENOUS | Status: DC | PRN
  Administered 2020-07-01: 40 mg via INTRAVENOUS
  Administered 2020-07-01: 10 mg via INTRAVENOUS

## 2020-07-01 MED ORDER — MIDAZOLAM HCL 2 MG/2ML IJ SOLN
INTRAMUSCULAR | Status: AC
  Filled 2020-07-01: qty 2

## 2020-07-01 MED ORDER — DEXAMETHASONE SODIUM PHOSPHATE 10 MG/ML IJ SOLN
INTRAMUSCULAR | Status: DC | PRN
  Administered 2020-07-01: 10 mg via INTRAVENOUS

## 2020-07-01 MED ORDER — SUCCINYLCHOLINE CHLORIDE 20 MG/ML IJ SOLN
INTRAMUSCULAR | Status: DC | PRN
  Administered 2020-07-01: 120 mg via INTRAVENOUS

## 2020-07-01 MED ORDER — FAMOTIDINE 20 MG PO TABS
ORAL_TABLET | ORAL | Status: AC
  Administered 2020-07-01: 08:00:00 20 mg via ORAL
  Filled 2020-07-01: qty 1

## 2020-07-01 MED ORDER — LIDOCAINE HCL (CARDIAC) PF 100 MG/5ML IV SOSY
PREFILLED_SYRINGE | INTRAVENOUS | Status: DC | PRN
  Administered 2020-07-01: 100 mg via INTRAVENOUS

## 2020-07-01 MED ORDER — FENTANYL CITRATE (PF) 100 MCG/2ML IJ SOLN: 25.0000 ug | INTRAMUSCULAR | Status: DC | PRN

## 2020-07-01 MED ORDER — BUPIVACAINE LIPOSOME 1.3 % IJ SUSP
INTRAMUSCULAR | Status: DC | PRN
  Administered 2020-07-01: 20 mL

## 2020-07-01 MED ORDER — GABAPENTIN 300 MG PO CAPS: 300.0000 mg | ORAL_CAPSULE | ORAL | Status: AC

## 2020-07-01 MED ORDER — BUPIVACAINE-EPINEPHRINE (PF) 0.25% -1:200000 IJ SOLN
INTRAMUSCULAR | Status: AC
  Filled 2020-07-01: qty 30

## 2020-07-01 MED ORDER — KETOROLAC TROMETHAMINE 30 MG/ML IJ SOLN
INTRAMUSCULAR | Status: DC | PRN
  Administered 2020-07-01: 30 mg via INTRAVENOUS

## 2020-07-01 MED ORDER — CHLORHEXIDINE GLUCONATE 0.12 % MT SOLN: 15.0000 mL | Freq: Once | OROMUCOSAL | Status: AC

## 2020-07-01 MED ORDER — LACTATED RINGERS IV SOLN: INTRAVENOUS | Status: DC

## 2020-07-01 MED ORDER — ONDANSETRON HCL 4 MG/2ML IJ SOLN
INTRAMUSCULAR | Status: DC | PRN
  Administered 2020-07-01: 4 mg via INTRAVENOUS

## 2020-07-01 MED ORDER — PROPOFOL 10 MG/ML IV BOLUS
INTRAVENOUS | Status: AC
  Filled 2020-07-01: qty 20

## 2020-07-01 MED ORDER — SCOPOLAMINE 1 MG/3DAYS TD PT72
MEDICATED_PATCH | TRANSDERMAL | Status: AC
  Filled 2020-07-01: qty 1

## 2020-07-01 MED ORDER — FAMOTIDINE 20 MG PO TABS: 20.0000 mg | ORAL_TABLET | Freq: Once | ORAL | Status: AC

## 2020-07-01 MED ORDER — ESMOLOL HCL 100 MG/10ML IV SOLN
INTRAVENOUS | Status: DC | PRN
  Administered 2020-07-01: 20 mg via INTRAVENOUS

## 2020-07-01 MED ORDER — FENTANYL CITRATE (PF) 100 MCG/2ML IJ SOLN
INTRAMUSCULAR | Status: AC
  Filled 2020-07-01: qty 2

## 2020-07-01 MED ORDER — BUPIVACAINE LIPOSOME 1.3 % IJ SUSP
INTRAMUSCULAR | Status: AC
  Filled 2020-07-01: qty 20

## 2020-07-01 MED ORDER — CELECOXIB 200 MG PO CAPS
ORAL_CAPSULE | ORAL | Status: AC
  Administered 2020-07-01: 08:00:00 200 mg via ORAL
  Filled 2020-07-01: qty 1

## 2020-07-01 MED ORDER — CEFAZOLIN SODIUM 1 G IJ SOLR
INTRAMUSCULAR | Status: AC
  Filled 2020-07-01: qty 30

## 2020-07-01 MED ORDER — GLYCOPYRROLATE 0.2 MG/ML IJ SOLN
INTRAMUSCULAR | Status: DC | PRN
  Administered 2020-07-01: .2 mg via INTRAVENOUS

## 2020-07-01 MED ORDER — ACETAMINOPHEN 500 MG PO TABS: 1000.0000 mg | ORAL_TABLET | ORAL | Status: AC

## 2020-07-01 MED ORDER — FENTANYL CITRATE (PF) 100 MCG/2ML IJ SOLN
INTRAMUSCULAR | Status: DC | PRN
  Administered 2020-07-01: 100 ug via INTRAVENOUS

## 2020-07-01 MED ORDER — GABAPENTIN 300 MG PO CAPS
ORAL_CAPSULE | ORAL | Status: AC
  Administered 2020-07-01: 08:00:00 300 mg via ORAL
  Filled 2020-07-01: qty 1

## 2020-07-01 MED ORDER — ONDANSETRON HCL 4 MG/2ML IJ SOLN: 4.0000 mg | Freq: Once | INTRAMUSCULAR | Status: DC | PRN

## 2020-07-01 MED ORDER — DEXMEDETOMIDINE (PRECEDEX) IN NS 20 MCG/5ML (4 MCG/ML) IV SYRINGE
PREFILLED_SYRINGE | INTRAVENOUS | Status: DC | PRN
  Administered 2020-07-01: 20 ug via INTRAVENOUS
  Administered 2020-07-01: 8 ug via INTRAVENOUS

## 2020-07-01 MED ORDER — ACETAMINOPHEN 500 MG PO TABS
ORAL_TABLET | ORAL | Status: AC
  Administered 2020-07-01: 08:00:00 1000 mg via ORAL
  Filled 2020-07-01: qty 2

## 2020-07-01 MED ORDER — SUGAMMADEX SODIUM 500 MG/5ML IV SOLN
INTRAVENOUS | Status: DC | PRN
  Administered 2020-07-01: 500 mg via INTRAVENOUS

## 2020-07-01 MED ORDER — BUPIVACAINE-EPINEPHRINE (PF) 0.25% -1:200000 IJ SOLN
INTRAMUSCULAR | Status: DC | PRN
  Administered 2020-07-01: 30 mL

## 2020-07-01 SURGICAL SUPPLY — 28 items
ADH SKN CLS APL DERMABOND .7 (GAUZE/BANDAGES/DRESSINGS) ×1
APL PRP STRL LF DISP 70% ISPRP (MISCELLANEOUS) ×1
BLADE SURG 15 STRL LF DISP TIS (BLADE) ×1 IMPLANT
BLADE SURG 15 STRL SS (BLADE) ×3
CANISTER SUCT 1200ML W/VALVE (MISCELLANEOUS) ×3 IMPLANT
CHLORAPREP W/TINT 26 (MISCELLANEOUS) ×3 IMPLANT
COVER WAND RF STERILE (DRAPES) ×3 IMPLANT
DERMABOND ADVANCED (GAUZE/BANDAGES/DRESSINGS) ×2
DERMABOND ADVANCED .7 DNX12 (GAUZE/BANDAGES/DRESSINGS) ×1 IMPLANT
DRAPE LAPAROTOMY 100X77 ABD (DRAPES) ×3 IMPLANT
ELECT REM PT RETURN 9FT ADLT (ELECTROSURGICAL) ×3
ELECTRODE REM PT RTRN 9FT ADLT (ELECTROSURGICAL) ×1 IMPLANT
GLOVE BIO SURGEON STRL SZ7 (GLOVE) ×3 IMPLANT
GOWN STRL REUS W/ TWL LRG LVL3 (GOWN DISPOSABLE) ×2 IMPLANT
GOWN STRL REUS W/TWL LRG LVL3 (GOWN DISPOSABLE) ×6
KIT TURNOVER KIT A (KITS) ×3 IMPLANT
LABEL OR SOLS (LABEL) ×3 IMPLANT
MANIFOLD NEPTUNE II (INSTRUMENTS) ×3 IMPLANT
NEEDLE HYPO 22GX1.5 SAFETY (NEEDLE) ×3 IMPLANT
NS IRRIG 500ML POUR BTL (IV SOLUTION) ×3 IMPLANT
PACK BASIN MINOR ARMC (MISCELLANEOUS) ×3 IMPLANT
SPONGE LAP 18X18 RF (DISPOSABLE) ×3 IMPLANT
SUT MNCRL 4-0 (SUTURE) ×3
SUT MNCRL 4-0 27XMFL (SUTURE) ×1
SUT VIC AB 0 CT1 36 (SUTURE) ×3 IMPLANT
SUT VIC AB 2-0 CT2 27 (SUTURE) ×3 IMPLANT
SUTURE MNCRL 4-0 27XMF (SUTURE) ×1 IMPLANT
SYR 10ML LL (SYRINGE) ×3 IMPLANT

## 2020-07-01 NOTE — Anesthesia Preprocedure Evaluation (Signed)
Anesthesia Evaluation  Patient identified by MRN, date of birth, ID band Patient awake    Reviewed: Allergy & Precautions, NPO status , Patient's Chart, lab work & pertinent test results  History of Anesthesia Complications (+) AWARENESS UNDER ANESTHESIA and history of anesthetic complications  Airway Mallampati: III       Dental   Pulmonary neg pulmonary ROS,           Cardiovascular negative cardio ROS       Neuro/Psych  Headaches, PSYCHIATRIC DISORDERS Anxiety Depression    GI/Hepatic negative GI ROS, Neg liver ROS,   Endo/Other  negative endocrine ROS  Renal/GU negative Renal ROS     Musculoskeletal negative musculoskeletal ROS (+)   Abdominal   Peds  Hematology negative hematology ROS (+)   Anesthesia Other Findings Past Medical History: No date: Allergy No date: Anxiety No date: Complication of anesthesia     Comment:  Has had to have more than anticipated. She began to wake              up. No date: Depression No date: Headache  Reproductive/Obstetrics                             Anesthesia Physical Anesthesia Plan  ASA: II  Anesthesia Plan: General   Post-op Pain Management:    Induction: Intravenous  PONV Risk Score and Plan:   Airway Management Planned: Oral ETT  Additional Equipment:   Intra-op Plan:   Post-operative Plan: Extubation in OR  Informed Consent: I have reviewed the patients History and Physical, chart, labs and discussed the procedure including the risks, benefits and alternatives for the proposed anesthesia with the patient or authorized representative who has indicated his/her understanding and acceptance.     Dental advisory given  Plan Discussed with: CRNA and Surgeon  Anesthesia Plan Comments:         Anesthesia Quick Evaluation

## 2020-07-01 NOTE — Transfer of Care (Addendum)
Immediate Anesthesia Transfer of Care Note  Patient: Dana Jennings  Procedure(s) Performed: EXCISION MASS ABDOMINAL (Left )  Patient Location: PACU  Anesthesia Type:General  Level of Consciousness: awake, alert  and patient cooperative  Airway & Oxygen Therapy: Patient Spontanous Breathing and Patient connected to face mask oxygen  Post-op Assessment: Report given to RN and Post -op Vital signs reviewed and stable  Post vital signs: Reviewed and stable  Last Vitals:  Vitals Value Taken Time  BP 127/65 07/01/20 1016  Temp    Pulse 107 07/01/20 1020  Resp 26 07/01/20 1020  SpO2 95 % 07/01/20 1020  Vitals shown include unvalidated device data.  Last Pain:  Vitals:   07/01/20 0813  TempSrc: Tympanic  PainSc: 0-No pain         Complications: No complications documented.

## 2020-07-01 NOTE — Interval H&P Note (Signed)
History and Physical Interval Note:  07/01/2020 9:08 AM  Dana Jennings  has presented today for surgery, with the diagnosis of abdominal wall mass.  The various methods of treatment have been discussed with the patient and family. After consideration of risks, benefits and other options for treatment, the patient has consented to  Procedure(s): EXCISION MASS ABDOMINAL (Left) as a surgical intervention.  The patient's history has been reviewed, patient examined, no change in status, stable for surgery.  I have reviewed the patient's chart and labs.  Questions were answered to the patient's satisfaction.     Shereena Berquist F Lilly Gasser

## 2020-07-01 NOTE — Op Note (Signed)
  07/01/2020  10:03 AM  PATIENT:  Dana Jennings  39 y.o. female  PRE-OPERATIVE DIAGNOSIS:  Abdominal wall mass  POST-OPERATIVE DIAGNOSIS:  Same  PROCEDURE:   1. Excision of abdominal wall mass measuring 3.2 cms diameter Deep including margins..   SURGEON:  Surgeon(s) and Role:    * Saulo Anthis F, MD - Primary  ANESTHESIA: GETA  EBL: minimal   DICTATION:  Patient was explained about the  Procedure in detail, risks, benefits and possible complications and a consent was obtained. The patient was taken to the operating room and placed in the supine position.   She was prepped and draped in the standard fashion. Incision was created after palpating the lesion. The lesion was deep and abutting the abdominal wall fascia. A 0 vicryl stitch was placed to elevate mass.  The mass measure 3.2 cms including adequate margins. Using cautery the mass was excised  Specimen was sent to pathology.  The wound was closed with 2-0 vicryl for the subq and 4-0 Monocryl for the skin in a subcuticular fashion.  Liposomal marcaine was injected. Dermabond was placed. Needle and laparotomy counts were correct and there were no immediate complications  Leafy Ro, MD

## 2020-07-01 NOTE — Anesthesia Procedure Notes (Signed)
Procedure Name: Intubation Performed by: Fletcher-Harrison, Karyss Frese, CRNA Pre-anesthesia Checklist: Patient identified, Emergency Drugs available, Suction available and Patient being monitored Patient Re-evaluated:Patient Re-evaluated prior to induction Oxygen Delivery Method: Circle system utilized Preoxygenation: Pre-oxygenation with 100% oxygen Induction Type: IV induction Ventilation: Mask ventilation without difficulty Tube type: Oral Tube size: 7.0 mm Number of attempts: 1 Airway Equipment and Method: Stylet and Oral airway Placement Confirmation: ETT inserted through vocal cords under direct vision,  positive ETCO2,  breath sounds checked- equal and bilateral and CO2 detector Secured at: 21 cm Tube secured with: Tape Dental Injury: Teeth and Oropharynx as per pre-operative assessment        

## 2020-07-01 NOTE — Discharge Instructions (Addendum)
Bupivacaine Liposomal Suspension for Injection °What is this medicine? °BUPIVACAINE LIPOSOMAL (bue PIV a kane LIP oh som al) is an anesthetic. It causes loss of feeling in the skin or other tissues. It is used to prevent and to treat pain from some procedures. °This medicine may be used for other purposes; ask your health care provider or pharmacist if you have questions. °COMMON BRAND NAME(S): EXPAREL °What should I tell my health care provider before I take this medicine? °They need to know if you have any of these conditions: °· G6PD deficiency °· heart disease °· kidney disease °· liver disease °· low blood pressure °· lung or breathing disease, like asthma °· an unusual or allergic reaction to bupivacaine, other medicines, foods, dyes, or preservatives °· pregnant or trying to get pregnant °· breast-feeding °How should I use this medicine? °This medicine is for injection into the affected area. It is given by a health care professional in a hospital or clinic setting. °Talk to your pediatrician regarding the use of this medicine in children. Special care may be needed. °Overdosage: If you think you have taken too much of this medicine contact a poison control center or emergency room at once. °NOTE: This medicine is only for you. Do not share this medicine with others. °What if I miss a dose? °This does not apply. °What may interact with this medicine? °This medicine may interact with the following medications: °· acetaminophen °· certain antibiotics like dapsone, nitrofurantoin, aminosalicylic acid, sulfonamides °· certain medicines for seizures like phenobarbital, phenytoin, valproic acid °· chloroquine °· cyclophosphamide °· flutamide °· hydroxyurea °· ifosfamide °· metoclopramide °· nitric oxide °· nitroglycerin °· nitroprusside °· nitrous oxide °· other local anesthetics like lidocaine, pramoxine, tetracaine °· primaquine °· quinine °· rasburicase °· sulfasalazine °This list may not describe all possible  interactions. Give your health care provider a list of all the medicines, herbs, non-prescription drugs, or dietary supplements you use. Also tell them if you smoke, drink alcohol, or use illegal drugs. Some items may interact with your medicine. °What should I watch for while using this medicine? °Your condition will be monitored carefully while you are receiving this medicine. °Be careful to avoid injury while the area is numb, and you are not aware of pain. °What side effects may I notice from receiving this medicine? °Side effects that you should report to your doctor or health care professional as soon as possible: °· allergic reactions like skin rash, itching or hives, swelling of the face, lips, or tongue °· seizures °· signs and symptoms of a dangerous change in heartbeat or heart rhythm like chest pain; dizziness; fast, irregular heartbeat; palpitations; feeling faint or lightheaded; falls; breathing problems °· signs and symptoms of methemoglobinemia such as pale, gray, or blue colored skin; headache; fast heartbeat; shortness of breath; feeling faint or lightheaded, falls; tiredness °Side effects that usually do not require medical attention (report to your doctor or health care professional if they continue or are bothersome): °· anxious °· back pain °· changes in taste °· changes in vision °· constipation °· dizziness °· fever °· nausea, vomiting °This list may not describe all possible side effects. Call your doctor for medical advice about side effects. You may report side effects to FDA at 1-800-FDA-1088. °Where should I keep my medicine? °This drug is given in a hospital or clinic and will not be stored at home. °NOTE: This sheet is a summary. It may not cover all possible information. If you have questions about this   medicine, talk to your doctor, pharmacist, or health care provider. °© 2020 [employer] (2019-04-14 10:48:23) ° ° °AMBULATORY SURGERY  °DISCHARGE INSTRUCTIONS ° ° °1) The  drugs that you were given will stay in your system until tomorrow so for the next 24 hours you should not: ° °A) Drive an automobile °B) Make any legal decisions °C) Drink any alcoholic beverage ° ° °2) You may resume regular meals tomorrow.  Today it is better to start with liquids and gradually work up to solid foods. ° °You may eat anything you prefer, but it is better to start with liquids, then soup and crackers, and gradually work up to solid foods. ° ° °3) Please notify your doctor immediately if you have any unusual bleeding, trouble breathing, redness and pain at the surgery site, drainage, fever, or pain not relieved by medication. ° ° ° °4) Additional Instructions: ° ° ° ° ° ° ° °Please contact your physician with any problems or Same Day Surgery at [phone], Monday through Friday 6 am to 4 pm, or [place] [facility] [place] Main number at [phone]. °

## 2020-07-04 LAB — SURGICAL PATHOLOGY

## 2020-07-04 NOTE — Anesthesia Postprocedure Evaluation (Signed)
Anesthesia Post Note  Patient: Dana Jennings  Procedure(s) Performed: EXCISION MASS ABDOMINAL (Left )  Patient location during evaluation: PACU Anesthesia Type: General Level of consciousness: awake and alert and oriented Pain management: pain level controlled Vital Signs Assessment: post-procedure vital signs reviewed and stable Respiratory status: spontaneous breathing Cardiovascular status: blood pressure returned to baseline Anesthetic complications: no   No complications documented.   Last Vitals:  Vitals:   07/01/20 1100 07/01/20 1105  BP: 134/72   Pulse: 88 87  Resp: (!) 21 19  Temp: 36.5 C   SpO2: 98% 98%    Last Pain:  Vitals:   07/04/20 0944  TempSrc:   PainSc: 2                  Jaishaun Mcnab

## 2020-07-20 ENCOUNTER — Other Ambulatory Visit: Payer: Self-pay

## 2020-07-20 ENCOUNTER — Telehealth (INDEPENDENT_AMBULATORY_CARE_PROVIDER_SITE_OTHER): Payer: BC Managed Care – PPO | Admitting: Surgery

## 2020-07-20 DIAGNOSIS — R1904 Left lower quadrant abdominal swelling, mass and lump: Secondary | ICD-10-CM

## 2020-07-20 NOTE — Progress Notes (Signed)
Virtual visit: I called patient on her cell phone and verified her identity Dana Jennings is a 40 year old female status post excision of abdominal wall mass.  Pathology consistent with endometrioma.  Results discussed with patient detail.  Patient is very appreciative and has no complaints.  Her baseline prior abdominal pain has subsided.  A/P Doing well w/o complications  RTC prn

## 2020-12-27 IMAGING — US US PELVIS LIMITED
1 series · 14 of 21 positions shown · non-contrast
Comparison: None.

CLINICAL DATA: Chronic left lower quadrant abdominal wall mass.

EXAM:
LIMITED ULTRASOUND OF PELVIS
TECHNIQUE: Limited transabdominal ultrasound examination of the pelvis was
performed.

[Series 1: us abdomen limited · 21 acquisitions, 14 frames shown]
[im 1/21]
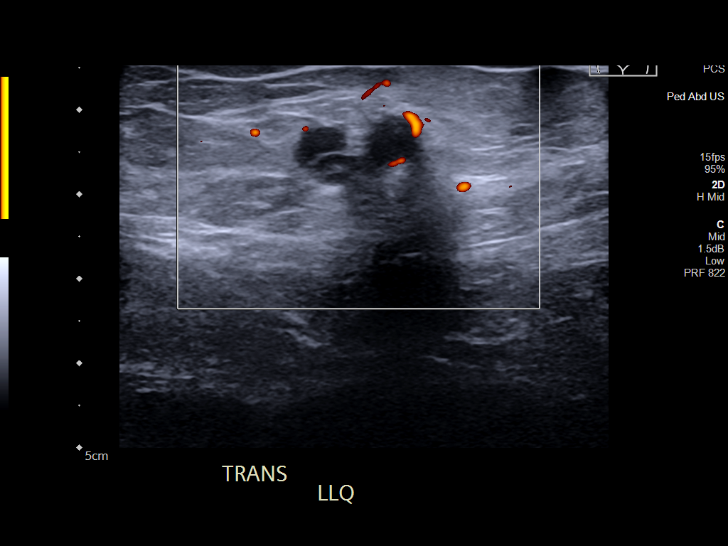
[im 3/21]
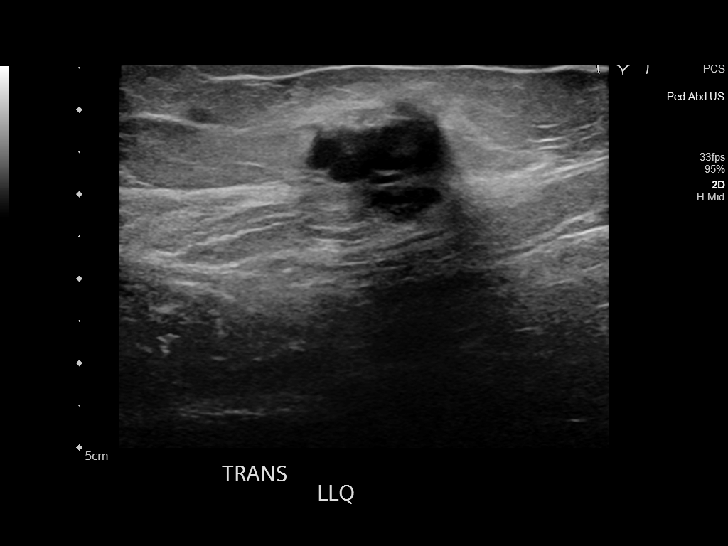
[im 4/21]
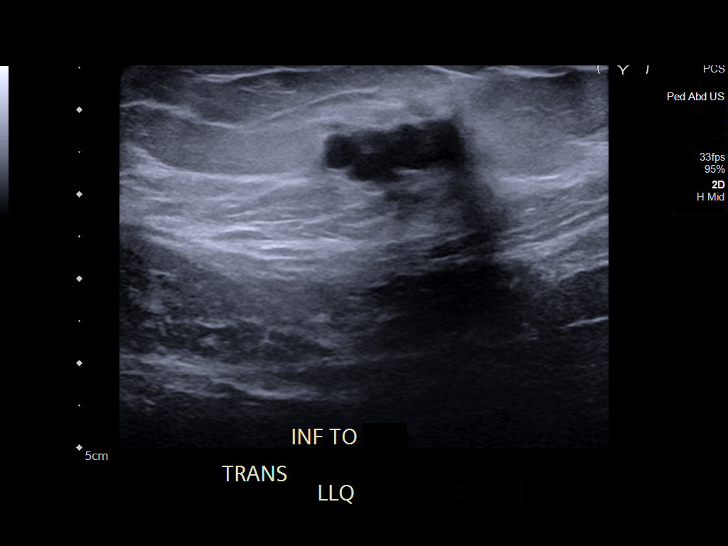
[im 6/21]
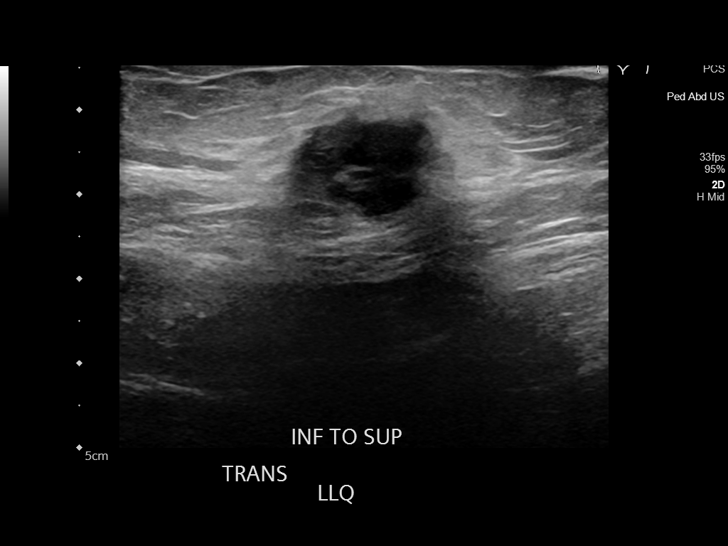
[im 7/21]
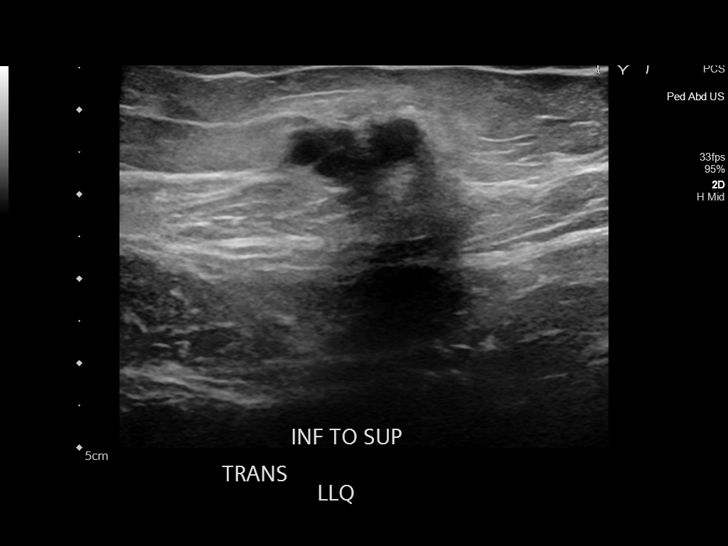
[im 9/21]
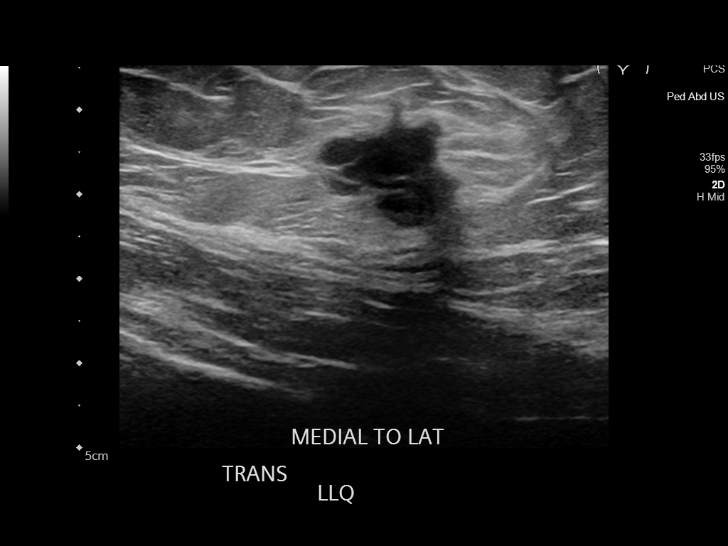
[im 10/21]
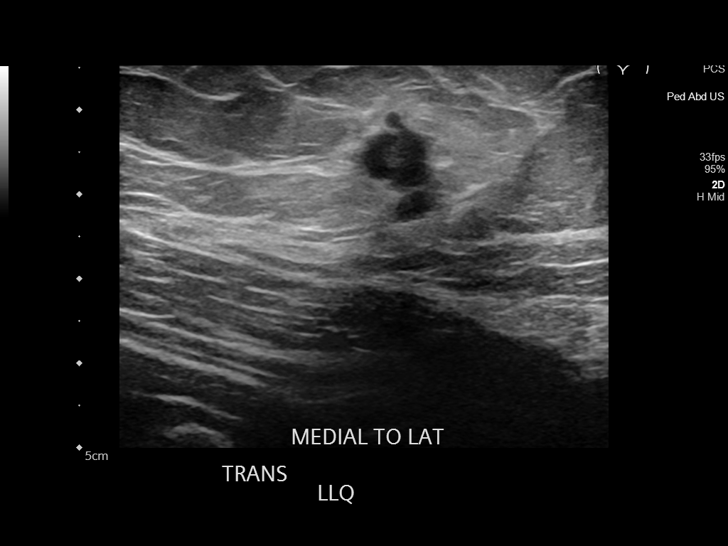
[im 12/21]
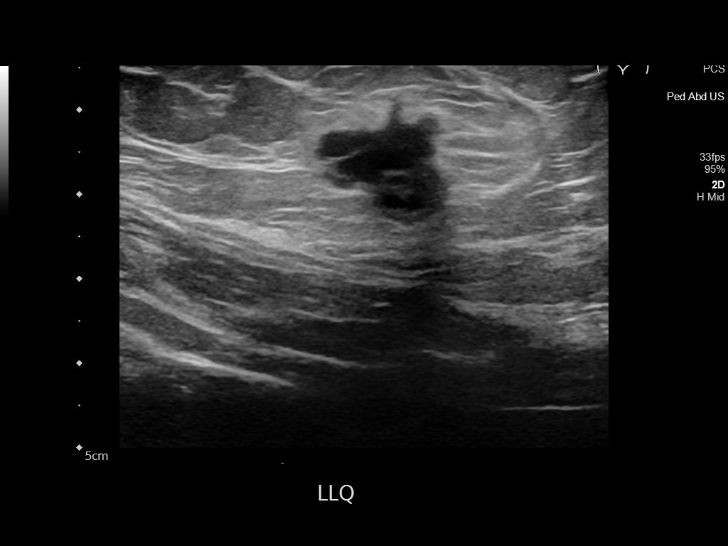
[im 13/21]
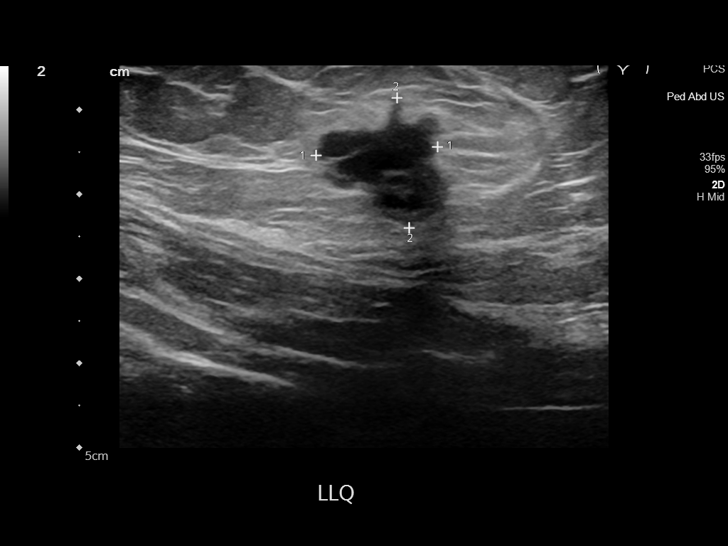
[im 15/21]
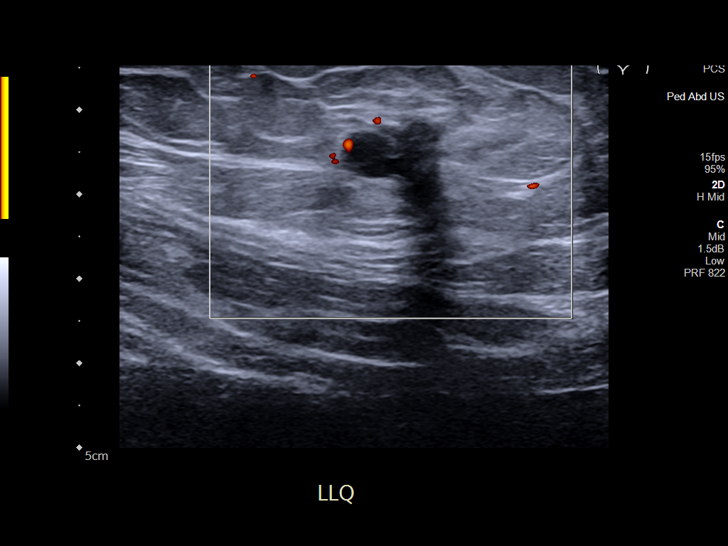
[im 16/21]
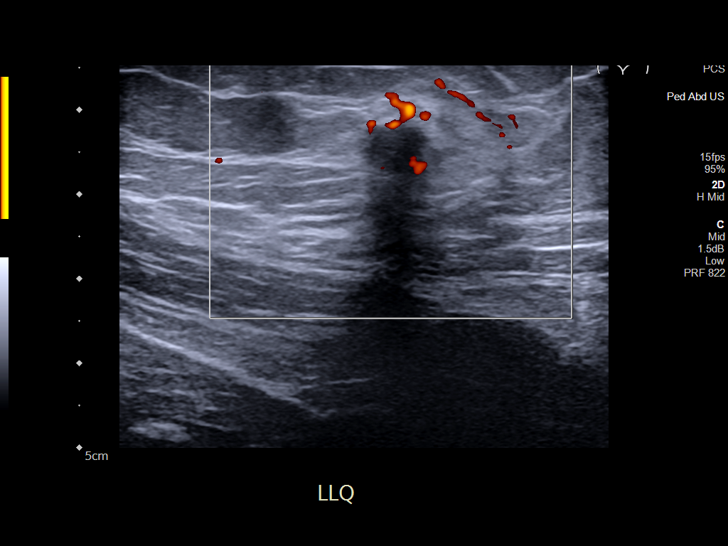
[im 18/21]
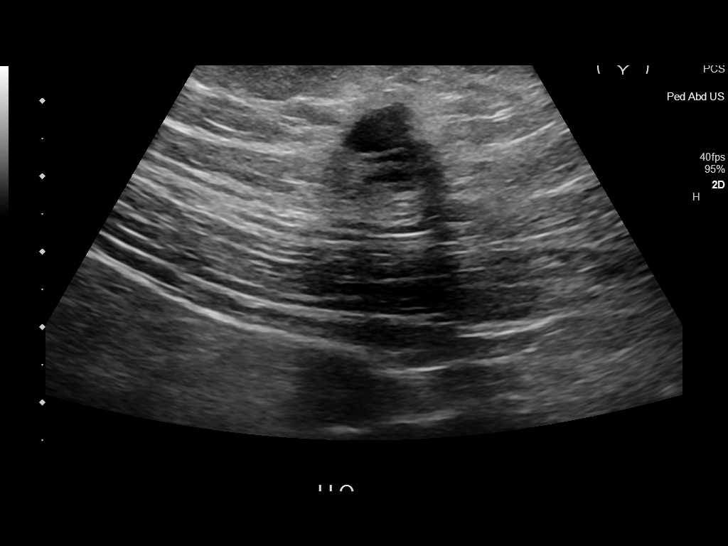
[im 19/21]
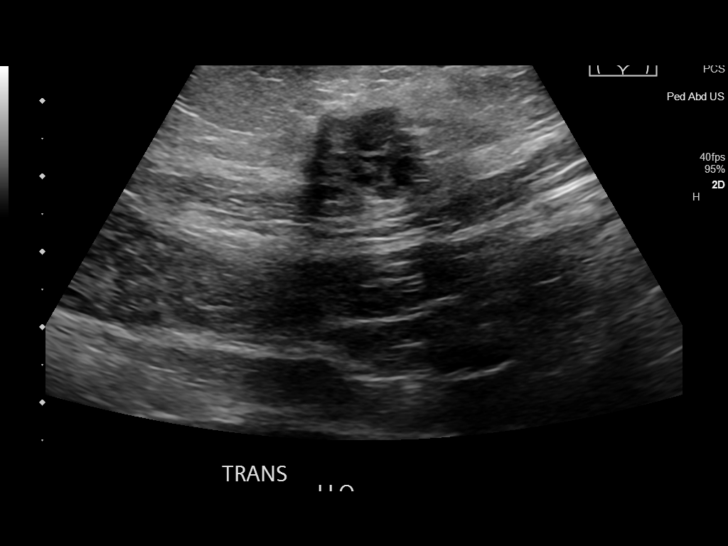
[im 21/21]
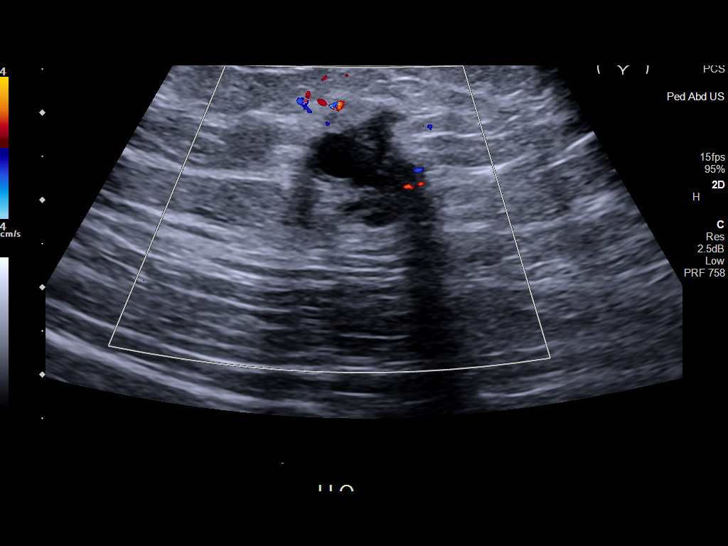

[14 of 21 positions shown; findings below may reference images not displayed]

FINDINGS: Limited sonographic evaluation was performed in the area of palpable
concern involving the left lower quadrant abdominal wall. 1.7 x
x 1.4 cm heterogeneous and lobulated abnormality is noted in the
area of palpable concern. This may represent complex cyst or fluid
collection, although possible mass cannot be excluded.
IMPRESSION: 1.7 x 1.6 x 1.4 cm heterogeneous and lobulated abnormality is seen
in the area of palpable concern in the left lower quadrant abdominal
wall, most consistent with complex multiloculated cyst or fluid
collection, although possible mass cannot be excluded. CT may be
performed for further evaluation.

## 2021-01-04 IMAGING — CT CT PELVIS W/ CM
1 series · 15 of 32 positions shown, 19 images · IV contrast (APPLIED)
Comparison: 06/13/2020

CLINICAL DATA: Pelvic pain, left inguinal mass since 08/04/2018

EXAM:
CT PELVIS WITH CONTRAST
TECHNIQUE: Multidetector CT imaging of the pelvis was performed using the
standard protocol following the bolus administration of intravenous
contrast.
CONTRAST:  125mL OMNIPAQUE IOHEXOL 300 MG/ML  SOLN

[Series 3: axial st · axial · 0.98mm/px · z∈[-1044,-764]mm · 15 of 62 slices shown, 19 images]
[im 4/62  soft-tissue]
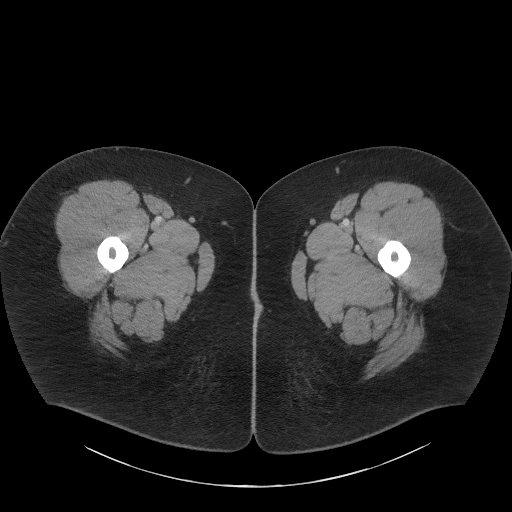
[im 4/62  bone]
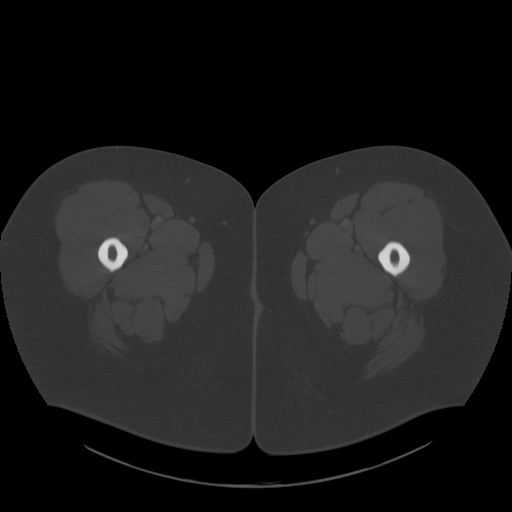
[im 8/62  soft-tissue]
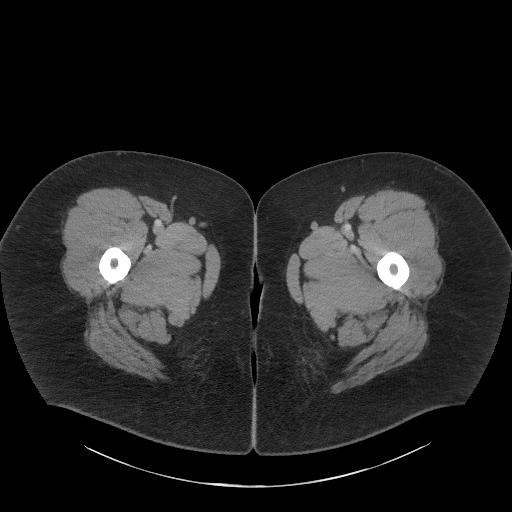
[im 12/62  soft-tissue]
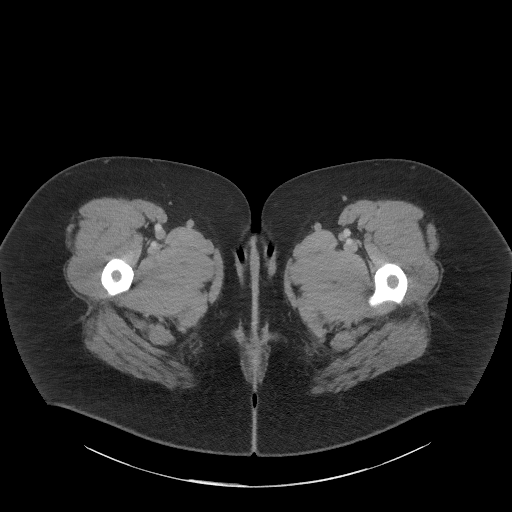
[im 18/62  soft-tissue]
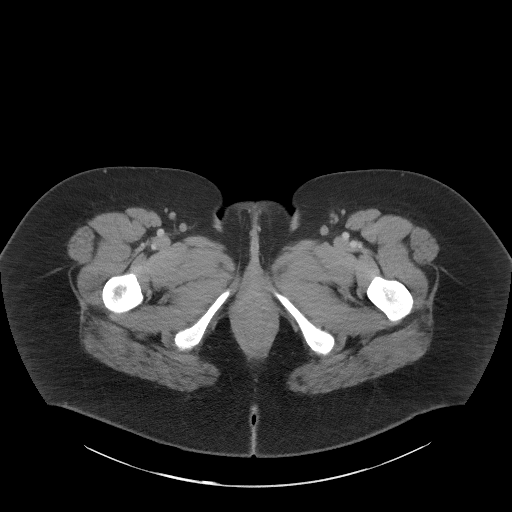
[im 22/62  soft-tissue]
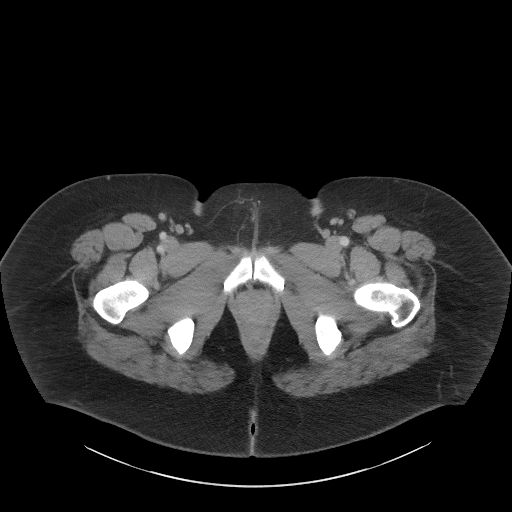
[im 26/62  soft-tissue]
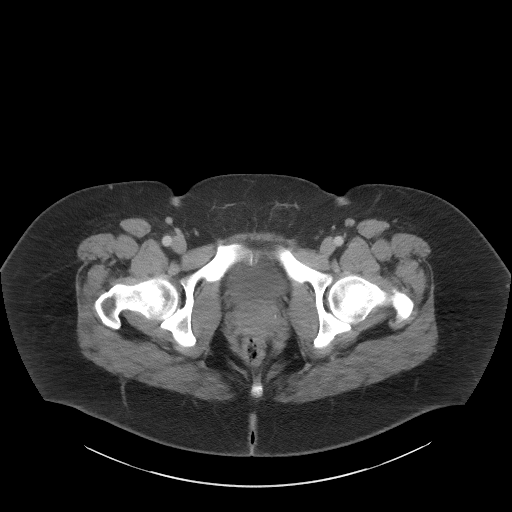
[im 32/62  soft-tissue]
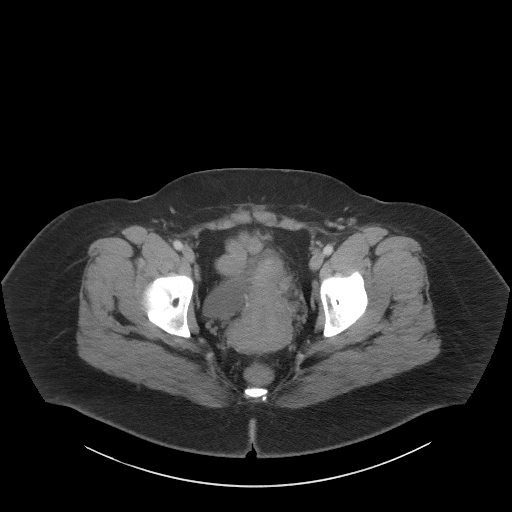
[im 36/62  soft-tissue]
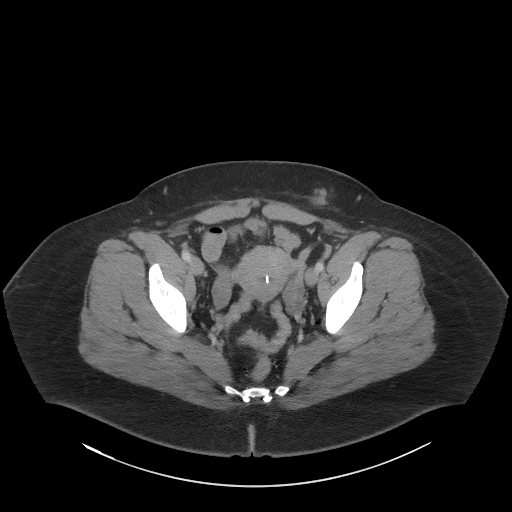
[im 40/62  soft-tissue]
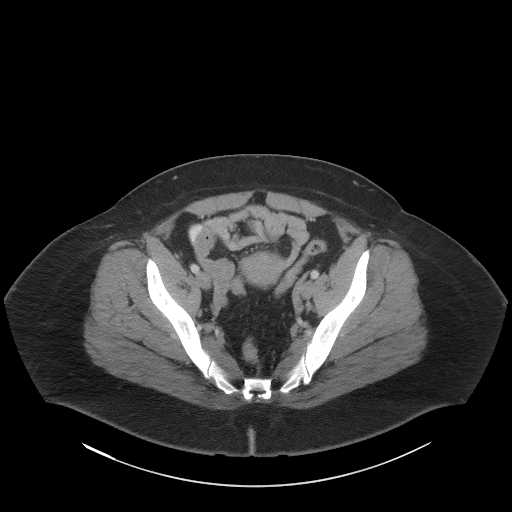
[im 40/62  bone]
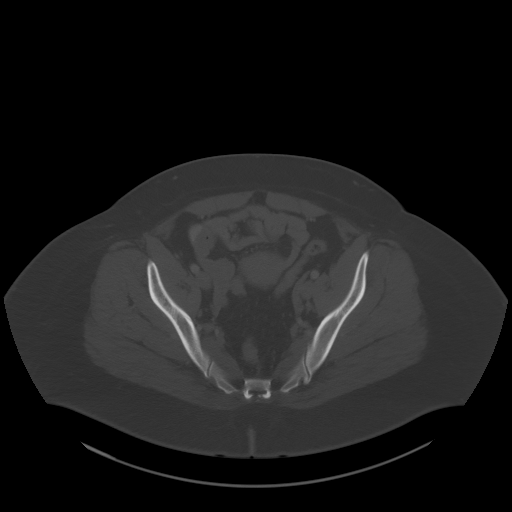
[im 44/62  soft-tissue]
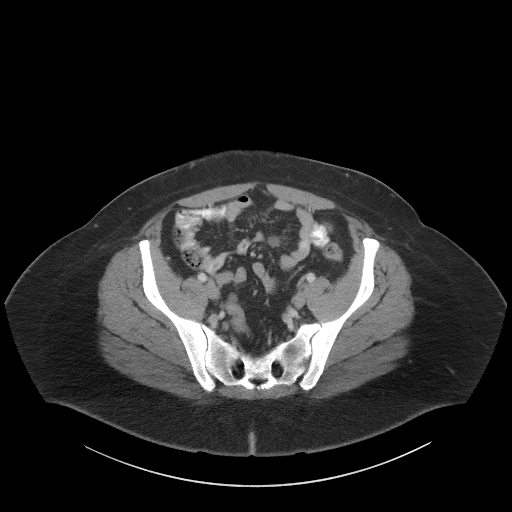
[im 50/62  soft-tissue]
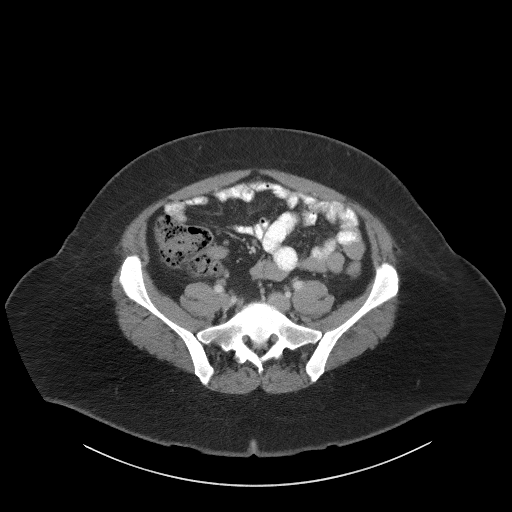
[im 54/62  soft-tissue]
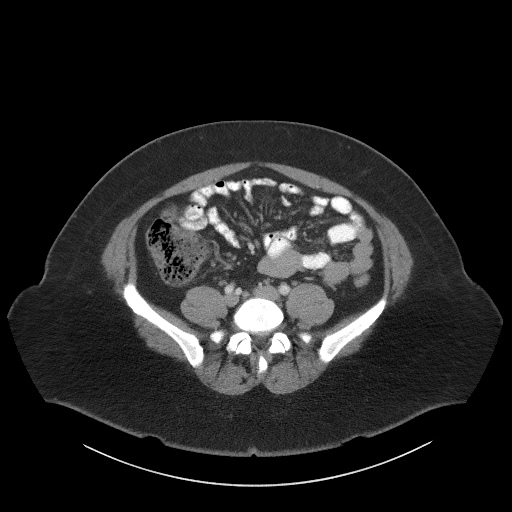
[im 54/62  lung]
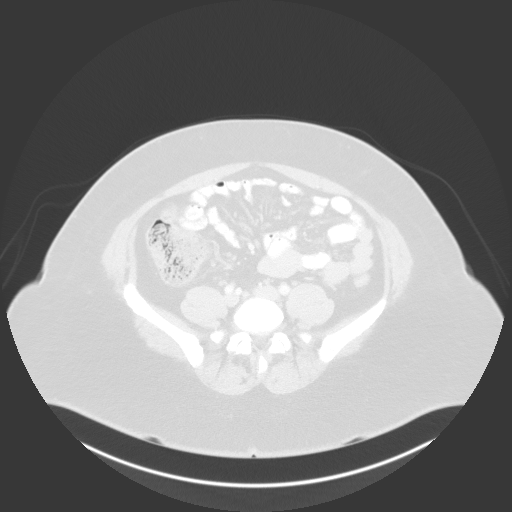
[im 56/62  lung]
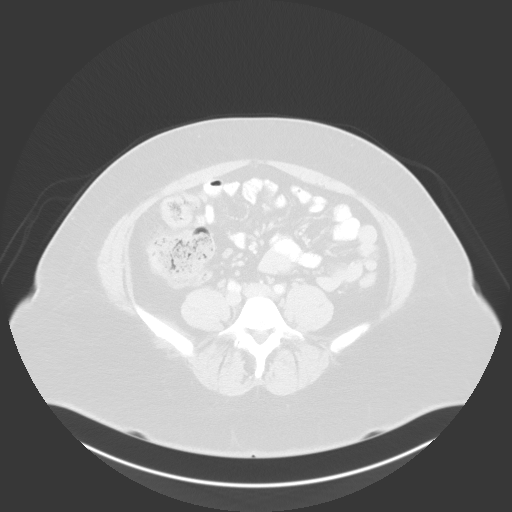
[im 58/62  soft-tissue]
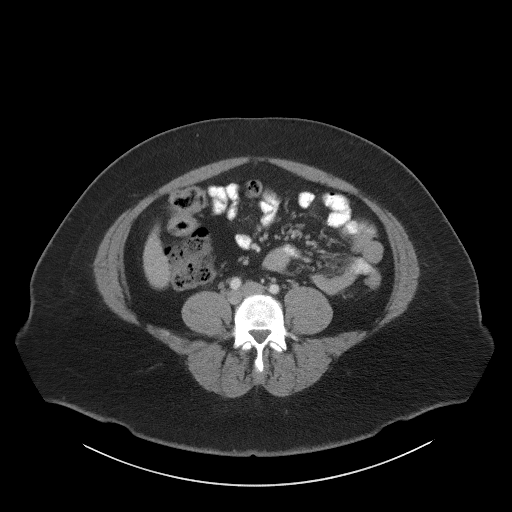
[im 58/62  lung]
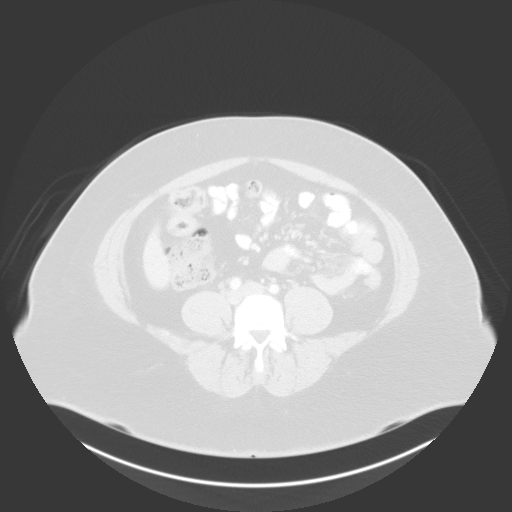
[im 60/62  lung]
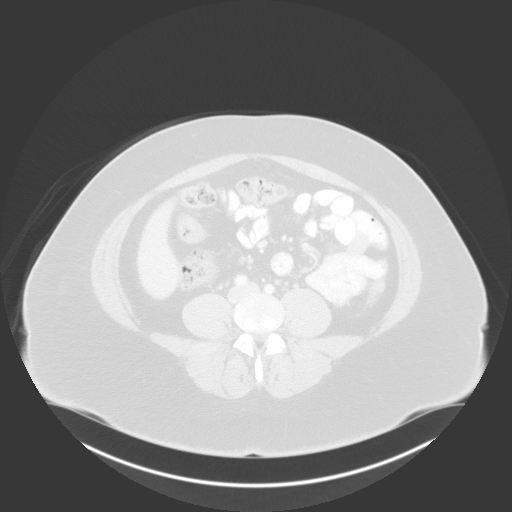

[15 of 32 positions shown; findings below may reference images not displayed]

FINDINGS: Urinary Tract:  No abnormality visualized.

Bowel: No bowel obstruction or ileus. No bowel wall thickening or
inflammatory change.

Vascular/Lymphatic: No significant atherosclerosis. No
pathologically enlarged lymph nodes within the pelvis or inguinal
regions.

Reproductive:  IUD within the uterus.  No adnexal masses.

Other: In the subcutaneous fat left lower quadrant anterior
abdominal wall, corresponding to the ultrasound abnormality, there
is a 1.5 x 1.8 x 1.9 cm masslike area with mean attenuation of 42
Hounsfield units. By ultrasound, this contain both solid and cystic
elements, with slight increased vascular flow along the periphery.
This cannot be further characterized by CT.

No evidence of dominant wall hernia.  No free fluid or free gas.

Musculoskeletal: No acute or destructive bony lesions. Reconstructed
images demonstrate no additional findings.
IMPRESSION: 1. Soft tissue mass within the left lower quadrant anterior
abdominal wall, localized within the subcutaneous fat. Etiology is
indeterminate based on CT imaging. Short interval follow-up
ultrasound could be performed to assess change in size. If more
urgent diagnosis is indicated, ultrasound-guided biopsy/aspiration
could be considered.
2. Otherwise unremarkable pelvic CT.

## 2022-12-06 ENCOUNTER — Other Ambulatory Visit (HOSPITAL_COMMUNITY): Payer: Self-pay

## 2022-12-06 MED ORDER — LISDEXAMFETAMINE DIMESYLATE 70 MG PO CAPS
70.0000 mg | ORAL_CAPSULE | Freq: Every day | ORAL | 0 refills | Status: AC
Start: 2022-12-06 — End: ?
  Filled 2022-12-06: qty 30, 30d supply, fill #0

## 2022-12-11 ENCOUNTER — Other Ambulatory Visit: Payer: Self-pay

## 2023-04-19 ENCOUNTER — Other Ambulatory Visit: Payer: Self-pay

## 2023-04-19 MED ORDER — LISDEXAMFETAMINE DIMESYLATE 70 MG PO CAPS
70.0000 mg | ORAL_CAPSULE | Freq: Every day | ORAL | 0 refills | Status: DC
  Filled 2023-04-19: qty 30, 30d supply, fill #0

## 2023-05-22 ENCOUNTER — Other Ambulatory Visit: Payer: Self-pay

## 2023-05-22 MED ORDER — LISDEXAMFETAMINE DIMESYLATE 70 MG PO CAPS
70.0000 mg | ORAL_CAPSULE | Freq: Every day | ORAL | 0 refills | Status: DC
  Filled 2023-05-22: qty 30, 30d supply, fill #0

## 2023-06-18 ENCOUNTER — Other Ambulatory Visit: Payer: Self-pay

## 2023-06-18 MED ORDER — ALPRAZOLAM 0.5 MG PO TABS: 0.5000 mg | ORAL_TABLET | Freq: Three times a day (TID) | ORAL | 2 refills | Status: AC | PRN

## 2023-06-28 ENCOUNTER — Other Ambulatory Visit: Payer: Self-pay

## 2023-06-28 MED ORDER — LISDEXAMFETAMINE DIMESYLATE 70 MG PO CAPS
70.0000 mg | ORAL_CAPSULE | Freq: Every day | ORAL | 0 refills | Status: DC
  Filled 2023-06-28: qty 30, 30d supply, fill #0

## 2023-07-24 ENCOUNTER — Other Ambulatory Visit: Payer: Self-pay

## 2023-07-24 MED ORDER — BUPROPION HCL ER (XL) 300 MG PO TB24
300.0000 mg | ORAL_TABLET | Freq: Every day | ORAL | 3 refills | Status: AC
  Filled 2023-07-24: qty 90, 90d supply, fill #0
  Filled 2023-12-14: qty 90, 90d supply, fill #1
  Filled 2024-03-09 – 2024-03-24 (×2): qty 90, 90d supply, fill #2
  Filled 2024-06-17: qty 90, 90d supply, fill #3

## 2023-07-31 ENCOUNTER — Other Ambulatory Visit: Payer: Self-pay

## 2023-07-31 MED ORDER — METFORMIN HCL 500 MG PO TABS
500.0000 mg | ORAL_TABLET | Freq: Every day | ORAL | 3 refills | Status: AC
  Filled 2023-07-31: qty 30, 30d supply, fill #0

## 2023-08-27 ENCOUNTER — Other Ambulatory Visit: Payer: Self-pay

## 2023-08-27 MED ORDER — LISDEXAMFETAMINE DIMESYLATE 70 MG PO CAPS
70.0000 mg | ORAL_CAPSULE | Freq: Every day | ORAL | 0 refills | Status: DC
  Filled 2023-08-27: qty 30, 30d supply, fill #0

## 2023-10-18 ENCOUNTER — Other Ambulatory Visit (HOSPITAL_COMMUNITY): Payer: Self-pay

## 2023-10-18 MED ORDER — LISDEXAMFETAMINE DIMESYLATE 70 MG PO CAPS
70.0000 mg | ORAL_CAPSULE | Freq: Every day | ORAL | 0 refills | Status: DC
  Filled 2023-10-18 – 2023-10-24 (×2): qty 30, 30d supply, fill #0

## 2023-10-24 ENCOUNTER — Other Ambulatory Visit (HOSPITAL_COMMUNITY): Payer: Self-pay

## 2023-10-24 ENCOUNTER — Other Ambulatory Visit: Payer: Self-pay

## 2023-11-25 ENCOUNTER — Other Ambulatory Visit: Payer: Self-pay

## 2023-11-25 MED ORDER — LISDEXAMFETAMINE DIMESYLATE 70 MG PO CAPS
70.0000 mg | ORAL_CAPSULE | Freq: Every day | ORAL | 0 refills | Status: DC
  Filled 2023-11-25: qty 30, 30d supply, fill #0

## 2023-12-27 ENCOUNTER — Other Ambulatory Visit: Payer: Self-pay

## 2023-12-27 MED ORDER — LISDEXAMFETAMINE DIMESYLATE 70 MG PO CAPS
70.0000 mg | ORAL_CAPSULE | Freq: Every day | ORAL | 0 refills | Status: DC
  Filled 2023-12-27: qty 30, 30d supply, fill #0

## 2024-01-30 ENCOUNTER — Other Ambulatory Visit: Payer: Self-pay

## 2024-01-30 MED ORDER — LISDEXAMFETAMINE DIMESYLATE 70 MG PO CAPS
70.0000 mg | ORAL_CAPSULE | Freq: Every day | ORAL | 0 refills | Status: DC
Start: 2024-01-30 — End: 2024-02-28
  Filled 2024-01-30: qty 30, 30d supply, fill #0

## 2024-02-28 ENCOUNTER — Other Ambulatory Visit: Payer: Self-pay

## 2024-02-28 MED ORDER — LISDEXAMFETAMINE DIMESYLATE 70 MG PO CAPS
70.0000 mg | ORAL_CAPSULE | Freq: Every day | ORAL | 0 refills | Status: DC
Start: 2024-02-28 — End: 2024-04-04
  Filled 2024-02-28: qty 30, 30d supply, fill #0

## 2024-03-19 ENCOUNTER — Other Ambulatory Visit: Payer: Self-pay

## 2024-03-24 ENCOUNTER — Other Ambulatory Visit: Payer: Self-pay

## 2024-04-04 ENCOUNTER — Other Ambulatory Visit: Payer: Self-pay

## 2024-04-04 MED ORDER — LISDEXAMFETAMINE DIMESYLATE 70 MG PO CAPS
70.0000 mg | ORAL_CAPSULE | Freq: Every day | ORAL | 0 refills | Status: AC
  Filled 2024-04-04: qty 20, 20d supply, fill #0
  Filled 2024-04-04: qty 10, 10d supply, fill #0

## 2024-04-14 ENCOUNTER — Other Ambulatory Visit: Payer: Self-pay

## 2024-05-06 ENCOUNTER — Other Ambulatory Visit: Payer: Self-pay

## 2024-05-06 MED ORDER — LISDEXAMFETAMINE DIMESYLATE 70 MG PO CAPS
70.0000 mg | ORAL_CAPSULE | Freq: Every day | ORAL | 0 refills | Status: AC
  Filled 2024-05-06: qty 30, 30d supply, fill #0

## 2024-05-26 ENCOUNTER — Other Ambulatory Visit: Payer: Self-pay

## 2024-05-26 MED ORDER — ALPRAZOLAM 0.5 MG PO TABS
0.5000 mg | ORAL_TABLET | Freq: Three times a day (TID) | ORAL | 2 refills | Status: AC | PRN
  Filled 2024-05-26: qty 60, 20d supply, fill #0
  Filled 2024-07-04: qty 60, 20d supply, fill #1

## 2024-06-09 ENCOUNTER — Other Ambulatory Visit: Payer: Self-pay

## 2024-06-09 MED ORDER — LISDEXAMFETAMINE DIMESYLATE 70 MG PO CAPS
70.0000 mg | ORAL_CAPSULE | Freq: Every day | ORAL | 0 refills | Status: AC
  Filled 2024-06-09: qty 30, 30d supply, fill #0

## 2024-06-18 ENCOUNTER — Other Ambulatory Visit: Payer: Self-pay

## 2024-06-20 ENCOUNTER — Other Ambulatory Visit: Payer: Self-pay

## 2024-06-23 ENCOUNTER — Other Ambulatory Visit: Payer: Self-pay

## 2024-07-06 ENCOUNTER — Other Ambulatory Visit: Payer: Self-pay

## 2024-07-13 ENCOUNTER — Other Ambulatory Visit: Payer: Self-pay

## 2024-07-13 MED ORDER — LISDEXAMFETAMINE DIMESYLATE 70 MG PO CAPS
70.0000 mg | ORAL_CAPSULE | Freq: Every day | ORAL | 0 refills | Status: AC
  Filled 2024-07-13: qty 30, 30d supply, fill #0
